# Patient Record
Sex: Female | Born: 1995 | Race: White | Hispanic: No | Marital: Single | Smoking: Never smoker
Health system: Western US, Academic
[De-identification: ages and names within clinical notes are randomized; demographics above are authoritative.]

## PROBLEM LIST (undated history)

## (undated) DIAGNOSIS — F410 Panic disorder [episodic paroxysmal anxiety] without agoraphobia: Secondary | ICD-10-CM

## (undated) DIAGNOSIS — E0590 Thyrotoxicosis, unspecified without thyrotoxic crisis or storm: Secondary | ICD-10-CM

## (undated) DIAGNOSIS — F250 Schizoaffective disorder, bipolar type: Secondary | ICD-10-CM

## (undated) DIAGNOSIS — F909 Attention-deficit hyperactivity disorder, unspecified type: Secondary | ICD-10-CM

## (undated) HISTORY — DX: Attention-deficit hyperactivity disorder, unspecified type: F90.9

---
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
## 2016-03-21 ENCOUNTER — Emergency Department (HOSPITAL_COMMUNITY): Payer: BLUE CROSS/BLUE SHIELD

## 2016-03-21 ENCOUNTER — Encounter (HOSPITAL_COMMUNITY): Payer: Self-pay | Admitting: Emergency Medicine

## 2016-03-21 ENCOUNTER — Emergency Department (HOSPITAL_COMMUNITY)
Admission: EM | Admit: 2016-03-21 | Discharge: 2016-03-23 | Disposition: A | Discharge status: Other Acute Inpatient Hospital | Payer: BLUE CROSS/BLUE SHIELD | Attending: Emergency Medicine | Admitting: Emergency Medicine

## 2016-03-21 DIAGNOSIS — N760 Acute vaginitis: Secondary | ICD-10-CM | POA: Insufficient documentation

## 2016-03-21 DIAGNOSIS — Z79899 Other long term (current) drug therapy: Secondary | ICD-10-CM | POA: Diagnosis not present

## 2016-03-21 DIAGNOSIS — R45851 Suicidal ideations: Secondary | ICD-10-CM | POA: Diagnosis present

## 2016-03-21 DIAGNOSIS — F909 Attention-deficit hyperactivity disorder, unspecified type: Secondary | ICD-10-CM | POA: Insufficient documentation

## 2016-03-21 DIAGNOSIS — F17210 Nicotine dependence, cigarettes, uncomplicated: Secondary | ICD-10-CM | POA: Diagnosis not present

## 2016-03-21 DIAGNOSIS — F411 Generalized anxiety disorder: Secondary | ICD-10-CM | POA: Diagnosis present

## 2016-03-21 DIAGNOSIS — F332 Major depressive disorder, recurrent severe without psychotic features: Secondary | ICD-10-CM | POA: Diagnosis not present

## 2016-03-21 DIAGNOSIS — B9689 Other specified bacterial agents as the cause of diseases classified elsewhere: Secondary | ICD-10-CM | POA: Diagnosis not present

## 2016-03-21 LAB — COMPREHENSIVE METABOLIC PANEL
ALT: 20 U/L (ref 14–54)
AST: 28 U/L (ref 15–41)
Albumin: 4.7 g/dL (ref 3.5–5.0)
Alkaline Phosphatase: 72 U/L (ref 38–126)
Anion gap: 7 (ref 5–15)
BILIRUBIN TOTAL: 0.8 mg/dL (ref 0.3–1.2)
BUN: 14 mg/dL (ref 6–20)
CALCIUM: 9.5 mg/dL (ref 8.9–10.3)
CHLORIDE: 103 mmol/L (ref 101–111)
CO2: 26 mmol/L (ref 22–32)
CREATININE: 0.73 mg/dL (ref 0.44–1.00)
Glucose, Bld: 89 mg/dL (ref 65–99)
Potassium: 4.5 mmol/L (ref 3.5–5.1)
Sodium: 136 mmol/L (ref 135–145)
TOTAL PROTEIN: 7.8 g/dL (ref 6.5–8.1)

## 2016-03-21 LAB — I-STAT BETA HCG BLOOD, ED (MC, WL, AP ONLY)

## 2016-03-21 LAB — URINALYSIS, ROUTINE W REFLEX MICROSCOPIC
Bilirubin Urine: NEGATIVE
Glucose, UA: NEGATIVE mg/dL
KETONES UR: NEGATIVE mg/dL
LEUKOCYTES UA: NEGATIVE
NITRITE: NEGATIVE
PROTEIN: NEGATIVE mg/dL
Specific Gravity, Urine: 1.009 (ref 1.005–1.030)
pH: 7 (ref 5.0–8.0)

## 2016-03-21 LAB — WET PREP, GENITAL
Sperm: NONE SEEN
Trich, Wet Prep: NONE SEEN
YEAST WET PREP: NONE SEEN

## 2016-03-21 LAB — CBC
HCT: 43.9 % (ref 36.0–46.0)
Hemoglobin: 15 g/dL (ref 12.0–15.0)
MCH: 30.1 pg (ref 26.0–34.0)
MCHC: 34.2 g/dL (ref 30.0–36.0)
MCV: 88 fL (ref 78.0–100.0)
PLATELETS: 339 10*3/uL (ref 150–400)
RBC: 4.99 MIL/uL (ref 3.87–5.11)
RDW: 13.6 % (ref 11.5–15.5)
WBC: 10.6 10*3/uL — ABNORMAL HIGH (ref 4.0–10.5)

## 2016-03-21 LAB — ETHANOL

## 2016-03-21 LAB — URINE MICROSCOPIC-ADD ON

## 2016-03-21 LAB — SALICYLATE LEVEL

## 2016-03-21 LAB — RAPID URINE DRUG SCREEN, HOSP PERFORMED
Amphetamines: NOT DETECTED
Barbiturates: NOT DETECTED
Benzodiazepines: POSITIVE — AB
Cocaine: NOT DETECTED
OPIATES: NOT DETECTED
Tetrahydrocannabinol: POSITIVE — AB

## 2016-03-21 LAB — ACETAMINOPHEN LEVEL: Acetaminophen (Tylenol), Serum: <10 ug/mL — ABNORMAL LOW (ref 10–30)

## 2016-03-21 MED ORDER — NICOTINE 7 MG/24HR TD PT24
7.0000 mg | MEDICATED_PATCH | Freq: Once | TRANSDERMAL | Status: AC
  Administered 2016-03-21: 7 mg via TRANSDERMAL
  Filled 2016-03-21: qty 1

## 2016-03-21 MED ORDER — ARIPIPRAZOLE 10 MG PO TABS
10.0000 mg | ORAL_TABLET | Freq: Every day | ORAL | Status: DC
  Administered 2016-03-21 – 2016-03-23 (×3): 10 mg via ORAL
  Filled 2016-03-21 (×3): qty 1

## 2016-03-21 MED ORDER — ALUM & MAG HYDROXIDE-SIMETH 200-200-20 MG/5ML PO SUSP: 30.0000 mL | ORAL | Status: DC | PRN

## 2016-03-21 MED ORDER — ALPRAZOLAM 0.5 MG PO TABS
0.5000 mg | ORAL_TABLET | Freq: Two times a day (BID) | ORAL | Status: DC | PRN
  Administered 2016-03-21 – 2016-03-22 (×3): 0.5 mg via ORAL
  Filled 2016-03-21 (×3): qty 1

## 2016-03-21 MED ORDER — IBUPROFEN 200 MG PO TABS
600.0000 mg | ORAL_TABLET | Freq: Three times a day (TID) | ORAL | Status: DC | PRN
  Administered 2016-03-22: 600 mg via ORAL
  Filled 2016-03-21: qty 3

## 2016-03-21 MED ORDER — METRONIDAZOLE 500 MG PO TABS
500.0000 mg | ORAL_TABLET | Freq: Two times a day (BID) | ORAL | Status: DC
  Administered 2016-03-21 – 2016-03-23 (×5): 500 mg via ORAL
  Filled 2016-03-21 (×5): qty 1

## 2016-03-21 MED ORDER — ZOLPIDEM TARTRATE 5 MG PO TABS
5.0000 mg | ORAL_TABLET | Freq: Every evening | ORAL | Status: DC | PRN
  Administered 2016-03-21: 5 mg via ORAL
  Filled 2016-03-21: qty 1

## 2016-03-21 MED ORDER — ONDANSETRON HCL 4 MG PO TABS: 4.0000 mg | ORAL_TABLET | Freq: Three times a day (TID) | ORAL | Status: DC | PRN

## 2016-03-21 MED ORDER — HYDROXYZINE HCL 25 MG PO TABS
25.0000 mg | ORAL_TABLET | Freq: Once | ORAL | Status: AC
  Administered 2016-03-21: 25 mg via ORAL
  Filled 2016-03-21: qty 1

## 2016-03-21 MED ORDER — ACETAMINOPHEN 325 MG PO TABS: 650.0000 mg | ORAL_TABLET | ORAL | Status: DC | PRN

## 2016-03-21 MED ORDER — IBUPROFEN 200 MG PO TABS
600.0000 mg | ORAL_TABLET | Freq: Once | ORAL | Status: AC
  Administered 2016-03-21: 600 mg via ORAL
  Filled 2016-03-21: qty 3

## 2016-03-21 NOTE — BH Assessment (Signed)
[facility] Assessment Progress Note  Case was staffed with Rankin NP who recommended an inpatient admission as appropriate bed placement is investigated.

## 2016-03-21 NOTE — BH Assessment (Signed)
[facility] Hudson[facility], confirms adult unit is at capacity. Faxed clinical information to the following facilities for placement:  [facility]   [name] [name] Jr, [facility], [facility], [facility] Triage Specialist [phone]
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
## 2016-03-22 ENCOUNTER — Encounter (HOSPITAL_COMMUNITY): Payer: Self-pay | Admitting: Registered Nurse

## 2016-03-22 DIAGNOSIS — R45851 Suicidal ideations: Secondary | ICD-10-CM | POA: Insufficient documentation

## 2016-03-22 DIAGNOSIS — F411 Generalized anxiety disorder: Secondary | ICD-10-CM

## 2016-03-22 DIAGNOSIS — F332 Major depressive disorder, recurrent severe without psychotic features: Secondary | ICD-10-CM | POA: Diagnosis not present

## 2016-03-22 DIAGNOSIS — B9689 Other specified bacterial agents as the cause of diseases classified elsewhere: Secondary | ICD-10-CM | POA: Insufficient documentation

## 2016-03-22 DIAGNOSIS — N760 Acute vaginitis: Secondary | ICD-10-CM

## 2016-03-22 DIAGNOSIS — Z79899 Other long term (current) drug therapy: Secondary | ICD-10-CM

## 2016-03-22 DIAGNOSIS — F17210 Nicotine dependence, cigarettes, uncomplicated: Secondary | ICD-10-CM

## 2016-03-22 LAB — PREGNANCY, URINE: Preg Test, Ur: NEGATIVE

## 2016-03-22 MED ORDER — TRAZODONE HCL 50 MG PO TABS
50.0000 mg | ORAL_TABLET | Freq: Every evening | ORAL | Status: DC | PRN
Start: 2016-03-22 — End: 2016-03-23
  Administered 2016-03-22: 50 mg via ORAL
  Filled 2016-03-22: qty 1

## 2016-03-22 MED ORDER — MIRTAZAPINE 7.5 MG PO TABS
7.5000 mg | ORAL_TABLET | Freq: Every day | ORAL | Status: DC
  Administered 2016-03-22: 7.5 mg via ORAL
  Filled 2016-03-22: qty 1

## 2016-03-22 MED ORDER — TRAZODONE HCL 50 MG PO TABS
50.0000 mg | ORAL_TABLET | Freq: Every day | ORAL | Status: DC
Start: 2016-03-22 — End: 2016-03-22

## 2016-03-22 MED ORDER — SERTRALINE HCL 50 MG PO TABS
25.0000 mg | ORAL_TABLET | Freq: Every day | ORAL | Status: DC
  Administered 2016-03-22: 25 mg via ORAL
  Filled 2016-03-22: qty 1

## 2016-03-22 MED ORDER — GABAPENTIN 100 MG PO CAPS: 200.0000 mg | ORAL_CAPSULE | Freq: Two times a day (BID) | ORAL | Status: DC

## 2016-03-22 MED ORDER — NICOTINE 7 MG/24HR TD PT24
7.0000 mg | MEDICATED_PATCH | Freq: Every day | TRANSDERMAL | Status: DC
  Administered 2016-03-22 – 2016-03-23 (×2): 7 mg via TRANSDERMAL
  Filled 2016-03-22: qty 1

## 2016-03-22 MED ORDER — HYDROXYZINE HCL 25 MG PO TABS
25.0000 mg | ORAL_TABLET | Freq: Three times a day (TID) | ORAL | Status: DC | PRN
  Administered 2016-03-22 – 2016-03-23 (×3): 25 mg via ORAL
  Filled 2016-03-22 (×4): qty 1
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
## 2016-03-22 NOTE — BH Assessment (Signed)
[facility] Assessment Progress Note    03/22/16: Patient continues to meet criteria for inpatient treatment as appropriate bed placement is investigated.     

## 2016-03-22 NOTE — ED Notes (Signed)
[facility] Charity fundraiserN. [facility]

## 2016-03-22 NOTE — ED Notes (Signed)
Report to include Situation, Background, Assessment, and Recommendations received from [place] RN. Patient alert and oriented, warm and dry, in no acute distress. Patient denies SI, HI, AVH and pain. Patient made aware of Q15 minute rounds and security cameras for their safety. Patient instructed to come to me with needs or concerns. 

## 2016-03-22 NOTE — ED Notes (Signed)
Patient noted in room. No complaints, stable, in no acute distress. Q15 minute rounds and monitoring via Security Cameras to continue.  

## 2016-03-22 NOTE — ED Notes (Signed)
Patient noted sleeping in room. No complaints, stable, in no acute distress. Q15 minute rounds and monitoring via Security Cameras to continue.  
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
## 2016-03-23 ENCOUNTER — Encounter (HOSPITAL_COMMUNITY): Payer: Self-pay

## 2016-03-23 ENCOUNTER — Inpatient Hospital Stay (HOSPITAL_COMMUNITY)
Admission: AD | Admit: 2016-03-23 | Discharge: 2016-03-25 | DRG: 880 | Disposition: A | Discharge status: Home / Self Care | Payer: BLUE CROSS/BLUE SHIELD | Source: Intra-hospital | Attending: Psychiatry | Admitting: Psychiatry

## 2016-03-23 DIAGNOSIS — F1410 Cocaine abuse, uncomplicated: Secondary | ICD-10-CM | POA: Diagnosis present

## 2016-03-23 DIAGNOSIS — Z915 Personal history of self-harm: Secondary | ICD-10-CM | POA: Diagnosis not present

## 2016-03-23 DIAGNOSIS — Z79899 Other long term (current) drug therapy: Secondary | ICD-10-CM | POA: Diagnosis not present

## 2016-03-23 DIAGNOSIS — F332 Major depressive disorder, recurrent severe without psychotic features: Secondary | ICD-10-CM | POA: Diagnosis present

## 2016-03-23 DIAGNOSIS — F909 Attention-deficit hyperactivity disorder, unspecified type: Secondary | ICD-10-CM | POA: Diagnosis present

## 2016-03-23 DIAGNOSIS — R45851 Suicidal ideations: Secondary | ICD-10-CM | POA: Diagnosis present

## 2016-03-23 DIAGNOSIS — A599 Trichomoniasis, unspecified: Secondary | ICD-10-CM | POA: Diagnosis present

## 2016-03-23 DIAGNOSIS — F1514 Other stimulant abuse with stimulant-induced mood disorder: Secondary | ICD-10-CM | POA: Diagnosis present

## 2016-03-23 DIAGNOSIS — F17210 Nicotine dependence, cigarettes, uncomplicated: Secondary | ICD-10-CM | POA: Diagnosis present

## 2016-03-23 DIAGNOSIS — F411 Generalized anxiety disorder: Principal | ICD-10-CM | POA: Diagnosis present

## 2016-03-23 DIAGNOSIS — F1010 Alcohol abuse, uncomplicated: Secondary | ICD-10-CM | POA: Diagnosis present

## 2016-03-23 DIAGNOSIS — G4700 Insomnia, unspecified: Secondary | ICD-10-CM | POA: Diagnosis present

## 2016-03-23 DIAGNOSIS — F603 Borderline personality disorder: Secondary | ICD-10-CM | POA: Diagnosis present

## 2016-03-23 DIAGNOSIS — F1220 Cannabis dependence, uncomplicated: Secondary | ICD-10-CM | POA: Diagnosis present

## 2016-03-23 DIAGNOSIS — F1490 Cocaine use, unspecified, uncomplicated: Secondary | ICD-10-CM

## 2016-03-23 DIAGNOSIS — F1994 Other psychoactive substance use, unspecified with psychoactive substance-induced mood disorder: Secondary | ICD-10-CM | POA: Insufficient documentation

## 2016-03-23 DIAGNOSIS — F0630 Mood disorder due to known physiological condition, unspecified: Secondary | ICD-10-CM

## 2016-03-23 LAB — GC/CHLAMYDIA PROBE AMP (~~LOC~~) NOT AT ARMC
CHLAMYDIA, DNA PROBE: NEGATIVE
Neisseria Gonorrhea: NEGATIVE

## 2016-03-23 MED ORDER — ACETAMINOPHEN 325 MG PO TABS: 650.0000 mg | ORAL_TABLET | Freq: Four times a day (QID) | ORAL | Status: DC | PRN

## 2016-03-23 MED ORDER — ARIPIPRAZOLE 10 MG PO TABS
10.0000 mg | ORAL_TABLET | Freq: Every day | ORAL | Status: DC
  Administered 2016-03-23 – 2016-03-25 (×3): 10 mg via ORAL
  Filled 2016-03-23 (×7): qty 1

## 2016-03-23 MED ORDER — MAGNESIUM HYDROXIDE 400 MG/5ML PO SUSP: 30.0000 mL | Freq: Every day | ORAL | Status: DC | PRN

## 2016-03-23 MED ORDER — NICOTINE 7 MG/24HR TD PT24
7.0000 mg | MEDICATED_PATCH | Freq: Every day | TRANSDERMAL | Status: DC
  Filled 2016-03-23 (×3): qty 1

## 2016-03-23 MED ORDER — MIRTAZAPINE 7.5 MG PO TABS
7.5000 mg | ORAL_TABLET | Freq: Every day | ORAL | Status: DC
  Administered 2016-03-23: 7.5 mg via ORAL
  Filled 2016-03-23 (×3): qty 1

## 2016-03-23 MED ORDER — IBUPROFEN 600 MG PO TABS
600.0000 mg | ORAL_TABLET | Freq: Three times a day (TID) | ORAL | Status: DC | PRN
  Administered 2016-03-23 – 2016-03-24 (×2): 600 mg via ORAL
  Filled 2016-03-23 (×2): qty 1

## 2016-03-23 MED ORDER — HYDROXYZINE HCL 50 MG PO TABS
50.0000 mg | ORAL_TABLET | Freq: Four times a day (QID) | ORAL | Status: DC | PRN
  Administered 2016-03-23 – 2016-03-24 (×4): 50 mg via ORAL
  Filled 2016-03-23 (×4): qty 1

## 2016-03-23 MED ORDER — TRAZODONE HCL 50 MG PO TABS
50.0000 mg | ORAL_TABLET | Freq: Every evening | ORAL | Status: DC | PRN
  Administered 2016-03-23 – 2016-03-24 (×2): 50 mg via ORAL
  Filled 2016-03-23 (×2): qty 1

## 2016-03-23 MED ORDER — ONDANSETRON HCL 4 MG PO TABS: 4.0000 mg | ORAL_TABLET | Freq: Three times a day (TID) | ORAL | Status: DC | PRN

## 2016-03-23 MED ORDER — METRONIDAZOLE 500 MG PO TABS
500.0000 mg | ORAL_TABLET | Freq: Two times a day (BID) | ORAL | Status: DC
  Administered 2016-03-23 – 2016-03-25 (×4): 500 mg via ORAL
  Filled 2016-03-23 (×8): qty 1

## 2016-03-23 MED ORDER — HYDROXYZINE HCL 25 MG PO TABS: 25.0000 mg | ORAL_TABLET | Freq: Three times a day (TID) | ORAL | Status: DC | PRN

## 2016-03-23 MED ORDER — NICOTINE 14 MG/24HR TD PT24
14.0000 mg | MEDICATED_PATCH | Freq: Every day | TRANSDERMAL | Status: DC
  Administered 2016-03-23 – 2016-03-25 (×2): 14 mg via TRANSDERMAL
  Filled 2016-03-23 (×6): qty 1

## 2016-03-23 MED ORDER — ALUM & MAG HYDROXIDE-SIMETH 200-200-20 MG/5ML PO SUSP: 30.0000 mL | ORAL | Status: DC | PRN
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
## 2016-03-23 NOTE — ED Notes (Signed)
Patient noted sleeping in room. No complaints, stable, in no acute distress. Q15 minute rounds and monitoring via Security Cameras to continue.  

## 2016-03-23 NOTE — ED Notes (Signed)
Pt discharged ambulatory with [place] driver.  Pt was calm and cooperative.  She denied S/I and H/I.  All belongings were sent with pt.

## 2016-03-23 NOTE — Tx Team (Addendum)
Initial Treatment Plan 03/23/2016 7:39 PM Kathy RolesMorgan Cheramie NWG:[id number]    PATIENT STRESSORS: Substance abuse   PATIENT STRENGTHS: Average or above average intelligence Communication skills   PATIENT IDENTIFIED PROBLEMS: Anxiety   Mood dysregulation   Risk for suicide  "get help"               DISCHARGE CRITERIA:  Adequate post-discharge living arrangements Improved stabilization in mood, thinking, and/or behavior Verbal commitment to aftercare and medication compliance  PRELIMINARY DISCHARGE PLAN: Attend PHP/IOP Attend 12-step recovery group Outpatient therapy  PATIENT/FAMILY INVOLVEMENT: This treatment plan has been presented to and reviewed with the patient, Kathy Hudson.  The patient and family have been given the opportunity to ask questions and make suggestions.  Larina Earthlyopson, Christa E, RN 03/23/2016, 7:39 PM
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
## 2016-03-23 NOTE — BH Assessment (Signed)
[facility] Assessment Progress Note  Per Thedore MinsMojeed Akintayo, MD, this pt [facility].  Per morning shift report, pt has been assigned to an Observation bed.  Pt has signed Voluntary Admission and Consent for Treatment, as well as Consent to Release Information to her parents, and to her outpatient providers, Dr Raquel JamesPittman and Durene Calaylor Krumroy, and signed forms have [facility].  Pt's nurse, Kendal Hymendie, has been notified, and agrees to send original paperwork along with pt via [facility], and to call report to [phone] or [phone].  Doylene Canninghomas Apphia Cropley, MA Triage Specialist [phone]

## 2016-03-23 NOTE — Progress Notes (Signed)
This Clinical research associatewriter informed pt that she was accepted to the adult unit based on her criteria. This Clinical research associatewriter ensured pt that she would be able to get visitation from her family as well as communicate with them on the phone. Pt stated "okay I am fine with going". Pt was receptive to this information and agreed to transfer to the unit. Kathy Hudson 03/23/16

## 2016-03-23 NOTE — Progress Notes (Signed)
Adult Psychoeducational Group Note  Date:  03/23/2016 Time:  10:26 PM  Group Topic/Focus:  Wrap-Up Group:   The focus of this group is to help patients review their daily goal of treatment and discuss progress on daily workbooks.   Participation Level:  Active  Participation Quality:  Appropriate  Affect:  Appropriate  Cognitive:  Alert  Insight: Appropriate  Engagement in Group:  Engaged  Modes of Intervention:  Discussion  Additional Comments:  Patient's goal was to get through the day. Larrisha Babineau L Raynah Gomes 03/23/2016, 10:26 PM

## 2016-03-23 NOTE — Progress Notes (Signed)
Patient ID: Kathy Hudson, female   DOB: Sep 11, 1995, 20 y.o.   MRN: [id number]  Writer received patient from Observation unit. Belongings re-searched and belongings able to come on the unit were given to patient. Relevant paper work was signed and patient was escorted to the unit. Patient was oriented to the unit and hygiene items were provided. Q15 minute safety checks were initiated and are maintained. Patient reports anxiety due to the fear of unknown being on the inpatient unit. Patient reports some tremors and stated that it started when she started on Abilify. Her left hand was visibly shaking at time of assessment. Patient also visibly anxious. She was given two stress balls to squeeze. She reports ETOH usage but minimizes use stating, "I don't have a problem with alcohol." She reports a bottle of wine atleast 4 times a week and does "black out" and harm self at times. She does have self inflicted scars on left forearm. She also reports THC and Cocaine usage. She does not speak to the positive benzodiazapine lab.

## 2016-03-23 NOTE — BH Assessment (Signed)
Pt has been accepted by Nira ConnJason Berry, [facility]. Notified Dr. Wilkie AyeHorton of acceptance.   Harlin RainFord [name] [name] Jr, LPC, [facility], [facility] Triage Specialist [phone]

## 2016-03-24 DIAGNOSIS — F411 Generalized anxiety disorder: Principal | ICD-10-CM

## 2016-03-24 DIAGNOSIS — A599 Trichomoniasis, unspecified: Secondary | ICD-10-CM

## 2016-03-24 DIAGNOSIS — F1490 Cocaine use, unspecified, uncomplicated: Secondary | ICD-10-CM

## 2016-03-24 DIAGNOSIS — F1010 Alcohol abuse, uncomplicated: Secondary | ICD-10-CM

## 2016-03-24 DIAGNOSIS — F1220 Cannabis dependence, uncomplicated: Secondary | ICD-10-CM

## 2016-03-24 DIAGNOSIS — Z79899 Other long term (current) drug therapy: Secondary | ICD-10-CM

## 2016-03-24 DIAGNOSIS — F332 Major depressive disorder, recurrent severe without psychotic features: Secondary | ICD-10-CM

## 2016-03-24 DIAGNOSIS — F1410 Cocaine abuse, uncomplicated: Secondary | ICD-10-CM

## 2016-03-24 LAB — TSH: TSH: 1.877 u[IU]/mL (ref 0.350–4.500)

## 2016-03-24 MED ORDER — LORAZEPAM 1 MG PO TABS
1.0000 mg | ORAL_TABLET | Freq: Four times a day (QID) | ORAL | Status: DC | PRN
  Administered 2016-03-24 – 2016-03-25 (×3): 1 mg via ORAL
  Filled 2016-03-24 (×3): qty 1

## 2016-03-24 MED ORDER — BUSPIRONE HCL 10 MG PO TABS
10.0000 mg | ORAL_TABLET | Freq: Two times a day (BID) | ORAL | Status: DC
  Administered 2016-03-24 – 2016-03-25 (×2): 10 mg via ORAL
  Filled 2016-03-24 (×6): qty 1
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
## 2016-03-24 NOTE — Progress Notes (Signed)
D: Pt denies SI/HI/AVH. Pt is pleasant and cooperative. Pt stated she was ready to leave tomorrow. Pt forwards little to Clinical research associatewriter.   A: Pt was offered support and encouragement. Pt was given scheduled medications. Pt was encourage to attend groups. Q 15 minute checks were done for safety.   R:Pt attends groups and interacts well with peers and staff. Pt is taking medication. Pt has no complaints.Pt receptive to treatment and safety maintained on unit.

## 2016-03-24 NOTE — Progress Notes (Signed)
D: Pt passive SI-contracts for safety denies HI/AVH. Pt is pleasant and cooperative. Pt very pessimistic with her attitude, pt stated she was doing this (rehab) for other people.   A: Pt was offered support and encouragement. Pt was given scheduled medications. Pt was encourage to attend groups. Q 15 minute checks were done for safety.   R:Pt attends groups and interacts well with peers and staff. Pt is taking medication. Pt receptive to treatment and safety maintained on unit.

## 2016-03-24 NOTE — Progress Notes (Signed)
Patient's BP rechecked manually and has stabilized. Pulse decreased since ativan admin.   Pitcher of gatorade given along with snack as patient remains pale and intake has been decreased today due to nausea, withdrawal.  Patient verbalizes understanding of need to push fluids and family remains at bed side.

## 2016-03-24 NOTE — [facility] Suicide Risk Assessment (Signed)
[facility]   Principal Problem: GAD (generalized anxiety disorder) Discharge Diagnoses:  Patient Active Problem List   Diagnosis Date Noted  . Trichomoniasis [A59.9] 03/24/2016  . Cocaine use [F14.10] 03/24/2016  . ETOH abuse [F10.10] 03/24/2016  . 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 MDD (major depressive disorder), recurrent severe, without psychosis (HCC) [F33.2] 03/23/2016  . Psychoactive substance-induced mood disorder (HCC) [R60.45[F19.94, F06.30] 03/23/2016  . Major depressive disorder, recurrent, severe without psychotic behavior (HCC) [F33.2] 03/22/2016  . GAD (generalized anxiety disorder) [F41.1] 03/22/2016  . BV (bacterial vaginosis) [N76.0, B96.89]   . Suicidal ideations [R45.851]   . ADHD (attention deficit hyperactivity disorder) [F90.9] 08/05/2014    Total Time spent with patient: 15 minutes  Musculoskeletal: Strength & Muscle Tone: within normal limits Gait & Station: normal Patient leans: N/A  Psychiatric Specialty Exam: ROS  Blood pressure 126/84, pulse (!) 120, temperature 97.9 F (36.6 C), temperature source Oral, resp. rate 18, height 5\' 4"  (1.626 m), weight 49.9 kg (110 lb), last menstrual period 02/04/2016, SpO2 98 %.Body mass index is 18.88 kg/m.  General Appearance: Casual  Eye Contact::  Good  Speech:  Clear and Coherent409  Volume:  Normal  Mood:  Anxious  Affect:  Congruent  Thought Process:  Coherent  Orientation:  Full (Time, Place, and Person)  Thought Content:  Negative  Suicidal Thoughts:  No  Homicidal Thoughts:  No  Memory:  Negative  Judgement:  Fair  Insight:  Fair  Psychomotor Activity:  Normal  Concentration:  Good  Recall:  Good  Fund of Knowledge:Good  Language: Good  Akathisia:  No  Handed:  Right  AIMS (if indicated):   0  Assets:  Resilience  Sleep:  Number of Hours: 5.5  Cognition: WNL  ADL's:  Intact   Mental Status Per Nursing Assessment::   On Admission:     Demographic  Factors:  Caucasian  Loss Factors: NA  Historical Factors: Prior suicide attempts, Family history of suicide, Family history of mental illness or substance abuse and Impulsivity  Risk Reduction Factors:   Living with another person, especially a relative, Positive social support, Positive therapeutic relationship and Positive coping skills or problem solving skills  Continued Clinical Symptoms:  Bipolar Disorder:   Mixed State Alcohol/Substance Abuse/Dependencies More than one psychiatric diagnosis  Cognitive Features That Contribute To Risk:  None    Suicide Risk:  Mild:  Suicidal ideation of limited frequency, intensity, duration, and specificity.  There are no identifiable plans, no associated intent, mild dysphoria and related symptoms, good self-control (both objective and subjective assessment), few other risk factors, and identifiable protective factors, including available and accessible social support.  Follow-up Information    [facility] Follow up on 03/25/2016.   Why:  Appt at 1:00PM with Ladona Ridgelaylor.  Contact information: [address] [place], [place] [address] Phone: [phone] Fax:       [facility] Follow up.   Specialty:  Behavioral Health Why:  message left requesting follow-up appt.  Contact information: [address] [place] [address] [phone]           Plan Of Care/Follow-up recommendations:  Other:  Patient denies suicidal or homicidal ideation, plan or intent at time of discharge. She will be returning to outpatient follow-up with her previous outpatient providers and is encouraged to abstain from substances, and keep her outpatient appointments.  Acquanetta SitElizabeth Woods Oates, MD 03/24/2016, 3:02 PM

## 2016-03-24 NOTE — Plan of Care (Signed)
Problem: Coping: Goal: Ability to identify and develop effective coping behavior will improve Outcome: Progressing Listening to music or writing in journal

## 2016-03-24 NOTE — Progress Notes (Signed)
Patient has had difficult time with withdrawal symptoms today. Complaining of nausea, tremors (observed) and shakiness, observable sweats, anxiety and elevated BP and pulse (see doc flowsheets).  Ativan prn given at 1200 and 1800 for CIWAs of an 11 and 12. Davis NP notified of evening VS (pulse is 149).   Will recheck VS within 1 hour of ativan admin. Patient also became pale, weak and felt as if she was going to pass out after blood draw. Assisted to room where she is currently lying down with family at bed side. Fluids provided and mother states patient has had this type of reaction since she was a young child.
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
## 2016-03-24 NOTE — Plan of Care (Signed)
Problem: Coping: Goal: Ability to identify and develop effective coping behavior will improve Outcome: Not Progressing Pt  Continues to endorse anxiety, but does not listen to advise about utilizing coping skills, she only comes up with excuses why she can't

## 2016-03-24 NOTE — Progress Notes (Signed)
Recreation Therapy Notes  Animal-Assisted Activity (AAA) Program Checklist/Progress Notes Patient Eligibility Criteria Checklist & Daily Group note for Rec TxIntervention  Date: 11.14.2017 Time: 2:45pm [address]  AAA/T Program Assumption of Risk Form signed by Patient/ or Parent Legal Guardian Yes  Patient is free of allergies or sever asthma Yes  Patient reports no fear of animals Yes  Patient reports no history of cruelty to animals Yes  Patient understands his/her participation is voluntary Yes  Behavioral Response: Did not attend.   Marykay Lexenise L Tejal Monroy, LRT/CTRS       Norman Bier L 03/24/2016 3:01 PM

## 2016-03-24 NOTE — [facility] Suicide Risk Assessment (Signed)
[facility]   Nursing information obtained from:    Demographic factors:    Current Mental Status:    Loss Factors:    Historical Factors:    Risk Reduction Factors:     Total Time spent with patient: 30 minutes Principal Problem: GAD (generalized anxiety disorder) Diagnosis:   Patient Active Problem List   Diagnosis Date Noted  . Trichomoniasis [A59.9] 03/24/2016  . Cocaine use [F14.10] 03/24/2016  . ETOH abuse [F10.10] 03/24/2016  . 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 MDD (major depressive disorder), recurrent severe, without psychosis ([facility]) [F33.2] 03/23/2016  . Psychoactive substance-induced mood disorder ([facility]) [W09.81[F19.94, F06.30] 03/23/2016  . Major depressive disorder, recurrent, severe without psychotic behavior ([facility]) [F33.2] 03/22/2016  . GAD (generalized anxiety disorder) [F41.1] 03/22/2016  . BV (bacterial vaginosis) [N76.0, B96.89]   . Suicidal ideations [R45.851]   . ADHD (attention deficit hyperactivity disorder) [F90.9] 08/05/2014   Subjective Data: Patient denies any current suicidal or homicidal ideation, plan or intent.  Continued Clinical Symptoms:  Alcohol Use Disorder Identification Test Final Score (AUDIT): 18 The "Alcohol Use Disorders Identification Test", Guidelines for Use in Primary Care, Second Edition.  World Science writerHealth Organization [facility](WHO). Score between 0-7:  no or low risk or alcohol related problems. Score between 8-15:  moderate risk of alcohol related problems. Score between 16-19:  high risk of alcohol related problems. Score 20 or above:  warrants further diagnostic evaluation for alcohol dependence and treatment.   CLINICAL FACTORS:   Severe Anxiety and/or Agitation Bipolar Disorder:   Mixed State Alcohol/Substance Abuse/Dependencies More than one psychiatric diagnosis Previous Psychiatric Diagnoses and Treatments   Musculoskeletal: Strength & Muscle Tone: within normal limits Gait & Station:  normal Patient leans: N/A  Psychiatric Specialty Exam: Physical Exam  ROS  Blood pressure 126/84, pulse (!) 120, temperature 97.9 F (36.6 C), temperature source Oral, resp. rate 18, height 5\' 4"  (1.626 m), weight 49.9 kg (110 lb), last menstrual period 02/04/2016, SpO2 98 %.Body mass index is 18.88 kg/m.   General Appearance: Casual  Eye Contact:  Good  Speech:  Clear and Coherent  Volume:  Normal  Mood:  Anxious and Euthymic  Affect:  Appropriate  Thought Process:  Coherent  Orientation:  Full (Time, Place, and Person)  Thought Content:  Negative  Suicidal Thoughts:  No  Homicidal Thoughts:  No  Memory:  Negative  Judgement:  Impaired  Insight:  Present  Psychomotor Activity:  Normal  Concentration:  Concentration: Good  Recall:  Good  Fund of Knowledge:  Good  Language:  Good  Akathisia:  No  Handed:  Right  AIMS (if indicated):   0  Assets:  Physical Health  ADL's:  Intact  Cognition:  WNL  Sleep:  Number of Hours: 5.5     COGNITIVE FEATURES THAT CONTRIBUTE TO RISK:  None    SUICIDE RISK:   Mild:  Suicidal ideation of limited frequency, intensity, duration, and specificity.  There are no identifiable plans, no associated intent, mild dysphoria and related symptoms, good self-control (both objective and subjective assessment), few other risk factors, and identifiable protective factors, including available and accessible social support.   PLAN OF CARE: see PAA  I certify that inpatient services furnished can reasonably be expected to improve the patient's condition.  Acquanetta SitElizabeth Woods Gean Larose, MD 03/24/2016, 12:45 PM

## 2016-03-24 NOTE — [facility] Suicide Risk Assessment (Signed)
[facility] INPATIENT:  Family/Significant Other Suicide Prevention Education  Suicide Prevention Education:  Education Completed; Kathy Hudson (pt's mother) [phone] has been identified by the patient as the family member/significant other with whom the patient will be residing, and identified as the person(s) who will aid the patient in the event of a mental health crisis (suicidal ideations/suicide attempt).  With written consent from the patient, the family member/significant other has been provided the following suicide prevention education, prior to the and/or following the discharge of the patient.  The suicide prevention education provided includes the following:  Suicide risk factors  Suicide prevention and interventions  National Suicide Hotline telephone number  [facility] assessment telephone number  [facility] Emergency Assistance 911  [facility] and/or Residential Mobile Crisis Unit telephone number  Request made of family/significant other to:  Remove weapons (e.g., guns, rifles, knives), all items previously/currently identified as safety concern.    Remove drugs/medications (over-the-counter, prescriptions, illicit drugs), all items previously/currently identified as a safety concern.  The family member/significant other verbalizes understanding of the suicide prevention education information provided.  The family member/significant other agrees to remove the items of safety concern listed above.  Kathy [place]ataway N Smart LCSW 03/24/2016, 1:48 PM

## 2016-03-24 NOTE — Tx Team (Signed)
Interdisciplinary Treatment and Diagnostic Plan Update  03/24/2016 Time of Session: 9:30AM Kathy Hudson MRN: [id number]  Principal Diagnosis: MDD  Secondary Diagnoses: Active Problems:   MDD (major depressive disorder), recurrent severe, without psychosis (HCC)   Psychoactive substance-induced mood disorder (HCC)   Current Medications:  Current Facility-Administered Medications  Medication Dose Route Frequency Provider Last Rate Last Dose  . acetaminophen (TYLENOL) tablet 650 mg  650 mg Oral Q6H PRN Charm RingsJamison Y Lord, NP      . alum & mag hydroxide-simeth (MAALOX/MYLANTA) 200-200-20 MG/5ML suspension 30 mL  30 mL Oral PRN Charm RingsJamison Y Lord, NP      . ARIPiprazole (ABILIFY) tablet 10 mg  10 mg Oral Daily Charm RingsJamison Y Lord, NP   10 mg at 03/24/16 0807  . busPIRone (BUSPAR) tablet 10 mg  10 mg Oral BID Acquanetta SitElizabeth Woods Oates, MD      . hydrOXYzine (ATARAX/VISTARIL) tablet 50 mg  50 mg Oral Q6H PRN Thermon LeylandLaura A Davis, NP   50 mg at 03/24/16 [id number]  . ibuprofen (ADVIL,MOTRIN) tablet 600 mg  600 mg Oral Q8H PRN Charm RingsJamison Y Lord, NP   600 mg at 03/23/16 2120  . LORazepam (ATIVAN) tablet 1 mg  1 mg Oral Q6H PRN Acquanetta SitElizabeth Woods Oates, MD      . magnesium hydroxide (MILK OF MAGNESIA) suspension 30 mL  30 mL Oral Daily PRN Charm RingsJamison Y Lord, NP      . metroNIDAZOLE (FLAGYL) tablet 500 mg  500 mg Oral Q12H Charm RingsJamison Y Lord, NP   500 mg at 03/24/16 [id number]  . nicotine (NICODERM CQ - dosed in mg/24 hours) patch 14 mg  14 mg Transdermal Daily Acquanetta SitElizabeth Woods Oates, MD   14 mg at 03/23/16 2020  . ondansetron (ZOFRAN) tablet 4 mg  4 mg Oral Q8H PRN Charm RingsJamison Y Lord, NP      . traZODone (DESYREL) tablet 50 mg  50 mg Oral QHS PRN Charm RingsJamison Y Lord, NP   50 mg at 03/23/16 2120   PTA Medications: Prescriptions Prior to Admission  Medication Sig Dispense Refill Last Dose  . ARIPiprazole (ABILIFY) 10 MG tablet Take 10 mg by mouth daily.   0 03/20/2016 at Unknown time    Patient Stressors: Substance abuse  Patient Strengths: Average  or above average intelligence Communication skills  Treatment Modalities: Medication Management, Group therapy, Case management,  1 to 1 session with clinician, Psychoeducation, Recreational therapy.   Physician Treatment Plan for Primary Diagnosis: MDD Long Term Goal(s):     Short Term Goals:    Medication Management: Evaluate patient's response, side effects, and tolerance of medication regimen.  Therapeutic Interventions: 1 to 1 sessions, Unit Group sessions and Medication administration.  Evaluation of Outcomes: Progressing  Physician Treatment Plan for Secondary Diagnosis: Active Problems:   MDD (major depressive disorder), recurrent severe, without psychosis (HCC)   Psychoactive substance-induced mood disorder (HCC)  Long Term Goal(s):     Short Term Goals:       Medication Management: Evaluate patient's response, side effects, and tolerance of medication regimen.  Therapeutic Interventions: 1 to 1 sessions, Unit Group sessions and Medication administration.  Evaluation of Outcomes: Progressing   RN Treatment Plan for Primary Diagnosis: MDD Long Term Goal(s): Knowledge of disease and therapeutic regimen to maintain health will improve  Short Term Goals: Ability to remain free from injury will improve, Ability to disclose and discuss suicidal ideas and Ability to identify and develop effective coping behaviors will improve  Medication Management: RN will administer medications as ordered  by provider, will assess and evaluate patient's response and provide education to patient for prescribed medication. RN will report any adverse and/or side effects to prescribing provider.  Therapeutic Interventions: 1 on 1 counseling sessions, Psychoeducation, Medication administration, Evaluate responses to treatment, Monitor vital signs and CBGs as ordered, Perform/monitor CIWA, COWS, AIMS and Fall Risk screenings as ordered, Perform wound care treatments as ordered.  Evaluation of  Outcomes: Progressing   LCSW Treatment Plan for Primary Diagnosis: MDD Long Term Goal(s): Safe transition to appropriate next level of care at discharge, Engage patient in therapeutic group addressing interpersonal concerns.  Short Term Goals: Engage patient in aftercare planning with referrals and resources, Facilitate patient progression through stages of change regarding substance use diagnoses and concerns and Identify triggers associated with mental health/substance abuse issues  Therapeutic Interventions: Assess for all discharge needs, 1 to 1 time with Social worker, Explore available resources and support systems, Assess for adequacy in community support network, Educate family and significant other(s) on suicide prevention, Complete Psychosocial Assessment, Interpersonal group therapy.  Evaluation of Outcomes: Progressing   Progress in Treatment: Attending groups: No. Patient new to unit. Continuing to assess.  Participating in groups: No. Taking medication as prescribed: Yes. Toleration medication: Yes. Family/Significant other contact made: No, will contact:  family member if patient consents. Patient understands diagnosis: Yes. Discussing patient identified problems/goals with staff: Yes. Medical problems stabilized or resolved: Yes. Denies suicidal/homicidal ideation: No. Passive SI/Able to contract for safety on the unit.  Issues/concerns per patient self-inventory: No. Other: n/a  New problem(s) identified: No, Describe:  n/a  New Short Term/Long Term Goal(s): Medication stabilization, development of appropriate and safe aftercare plan.   Discharge Plan or Barriers: CSW assessing for appropriate referrals. Patient currently goes to [facility] and sees Dr. Raquel JamesPittman for medication management. She has a diagnosis of Borderline personality Disorder and ADHD.   Reason for Continuation of Hospitalization: Anxiety Depression Medication stabilization Suicidal  ideation  Estimated Length of Stay: 3-5 days   Attendees: Patient: 03/24/2016 11:08 AM  Physician: Dr. Vallery RidgeElizabeth Oates MD 03/24/2016 11:08 AM  Nursing: Dorthula PerfectMarian, Caroline, Beverly RN 03/24/2016 11:08 AM  RN Care Manager: Onnie BoerJennifer Clark CM 03/24/2016 11:08 AM  Social Worker: Chartered loss adjusterHeather Smart, LCSW 03/24/2016 11:08 AM  Recreational Therapist:  03/24/2016 11:08 AM  Other: Claudette Headonrad Withrow NP; May Augustin NP 03/24/2016 11:08 AM  Other:  03/24/2016 11:08 AM  Other: 03/24/2016 11:08 AM    Scribe for Treatment Team: Ledell PeoplesHeather N Smart, LCSW 03/24/2016 11:08 AM

## 2016-03-24 NOTE — Plan of Care (Signed)
Problem: Self-Concept: Goal: Level of anxiety will decrease Outcome: Progressing Coping skills established

## 2016-03-24 NOTE — [facility] Group Notes (Signed)
[facility] LCSW Group Therapy  03/24/2016 1:58 PM  Type of Therapy:  Group Therapy  Participation Level:  Active  Participation Quality:  Attentive  Affect:  Appropriate  Cognitive:  Alert and Oriented  Insight:  Improving  Engagement in Therapy:  Improving  Modes of Intervention:  Discussion, Education, Exploration, Problem-solving, Rapport Building, Socialization and Support  Summary of Progress/Problems: [facility] Speaker came to talk about his personal journey with substance abuse and addiction. The pt processed ways by which to relate to the speaker. MHA speaker provided handouts and educational information pertaining to groups and services offered by the [facility].   Yukio Bisping N Smart LCSW 03/24/2016, 1:58 PM

## 2016-03-24 NOTE — Progress Notes (Signed)
Patient attended AA group meeting tonight.  

## 2016-03-24 NOTE — Progress Notes (Addendum)
Nursing Progress Note: 7a-7p D: Pt currently presents with a anxious/depressed  behavior. Pt states "I cant stop shaking. I am so anxious... 10 out of 10... I usually took 2 mg of Xanax every night." Pt reports poor sleep with current medication regimen.   A: Pt provided with medications per providers orders. Pt's labs and vitals were monitored throughout the day. Pt supported emotionally and encouraged to express concerns and questions. Pt educated on medications.  R: Pt's safety ensured with 15 minute and environmental checks. Pt currently denies SI/HI/Self Harm and A/V hallucinations. Pt verbally agrees to seek staff if SI/HI or A/VH occurs and to consult with staff before acting on any harmful thoughts. Will continue POC.

## 2016-03-24 NOTE — [facility] Group Notes (Signed)

## 2016-03-24 NOTE — Plan of Care (Signed)
Problem: Coping: Goal: Ability to cope will improve Outcome: Progressing Coping skills

## 2016-03-25 MED ORDER — ARIPIPRAZOLE 10 MG PO TABS: 10.0000 mg | ORAL_TABLET | Freq: Every day | ORAL | 0 refills | Status: DC

## 2016-03-25 MED ORDER — HYDROXYZINE HCL 50 MG PO TABS: 50.0000 mg | ORAL_TABLET | Freq: Four times a day (QID) | ORAL | 0 refills | Status: DC | PRN

## 2016-03-25 MED ORDER — TRAZODONE HCL 50 MG PO TABS: 50.0000 mg | ORAL_TABLET | Freq: Every evening | ORAL | 0 refills | Status: DC | PRN

## 2016-03-25 MED ORDER — METRONIDAZOLE 500 MG PO TABS: 500.0000 mg | ORAL_TABLET | Freq: Two times a day (BID) | ORAL | 0 refills | Status: DC

## 2016-03-25 MED ORDER — NICOTINE 14 MG/24HR TD PT24: 14.0000 mg | MEDICATED_PATCH | Freq: Every day | TRANSDERMAL | 0 refills | Status: DC

## 2016-03-25 MED ORDER — BUSPIRONE HCL 10 MG PO TABS: 10.0000 mg | ORAL_TABLET | Freq: Two times a day (BID) | ORAL | 0 refills | Status: DC

## 2016-03-25 NOTE — Progress Notes (Signed)
Recreation Therapy Notes  Date: 03/25/16 Time: 0930 Location: 300 [address] Dayroom  Group Topic: Stress Management  Goal Area(s) Addresses:  Patient will verbalize importance of using healthy stress management.  Patient will identify positive emotions associated with healthy stress management.   Behavioral Response: Engaged  Intervention: Calm App  Activity :  Resilience Meditation.  LRT introduced the stress management technique of meditation.  LRT played a meditation to help patients improve resilience.  Patients were to follow along with the recording to the best of their ability to engaged in the technique.  Education:  Stress Management, Discharge Planning.   Education Outcome: Acknowledges edcuation/In group clarification offered/Needs additional education  Clinical Observations/Feedback: Pt attended group.    Caroll RancherMarjette Jovonni Borquez, LRT/CTRS         Lillia AbedLindsay, Gideon Burstein A 03/25/2016 12:00 PM

## 2016-03-25 NOTE — Discharge Summary (Signed)
Physician Discharge Summary Note  Patient:  Kathy Hudson is an 20 y.o., female MRN:  [id number] DOB:  08/19/1995 Patient phone:  [phone] (home)  Patient address:   [address] [place] [address],  Total Time spent with patient: 30 minutes  Date of Admission:  03/23/2016 Date of Discharge: 03/25/2016  Reason for Admission:  Alcohol and cocaine abuse  Principal Problem: GAD (generalized anxiety disorder) Discharge Diagnoses: Patient Active Problem List   Diagnosis Date Noted  . Trichomoniasis [A59.9] 03/24/2016  . Cocaine use [F14.10] 03/24/2016  . ETOH abuse [F10.10] 03/24/2016  . 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 MDD (major depressive disorder), recurrent severe, without psychosis (HCC) [F33.2] 03/23/2016  . Psychoactive substance-induced mood disorder (HCC) [N62.95, F06.30] 03/23/2016  . Major depressive disorder, recurrent, severe without psychotic behavior (HCC) [F33.2] 03/22/2016  . GAD (generalized anxiety disorder) [F41.1] 03/22/2016  . BV (bacterial vaginosis) [N76.0, B96.89]   . Suicidal ideations [R45.851]   . ADHD (attention deficit hyperactivity disorder) [F90.9] 08/05/2014    Past Psychiatric History:  See HPI  Past Medical History:  Past Medical History:  Diagnosis Date  . ADHD (attention deficit hyperactivity disorder)    History reviewed. No pertinent surgical history. Family History: No family history on file. Family Psychiatric  History:  See HPI Social History:  History  Alcohol Use No     History  Drug Use  . Types: Benzodiazepines, Marijuana    Social History   Social History  . Marital status: Single    Spouse name: N/A  . Number of children: N/A  . Years of education: N/A   Social History Main Topics  . Smoking status: Light Tobacco Smoker    Types: Cigarettes  . Smokeless tobacco: Never Used  . Alcohol use No  . Drug use:     Types: Benzodiazepines, Marijuana  . Sexual activity: Yes    Birth  control/ protection: None   Other Topics Concern  . None   Social History Narrative  . None    Hospital Course:  Kathy Hudson, 20 yo was admitted after she abused alcohol and cocaine as well as taking Xanax that she obtained from an outpatient provider.  This brought on a manic phase wherein she engaged in highly unsafe sexual activity.    Kathy Hudson was admitted for GAD (generalized anxiety disorder) and crisis management.  Patient was treated with medications with their indications listed below in detail under Medication List.  Medical problems were identified and treated as needed.  Home medications were restarted as appropriate.  Improvement was monitored by observation and Kathy Hudson daily report of symptom reduction.  Emotional and mental status was monitored by daily self inventory reports completed by Kathy Hudson and clinical staff.  Patient reported continued improvement, denied any new concerns.  Patient had been compliant on medications and denied side effects.  Support and encouragement was provided.    Patient encouraged to attend groups to help with recognizing triggers of emotional crises and de-stabilizations.  Patient encouraged to attend group to help identify the positive things in life that would help in dealing with feelings of loss, depression and unhealthy or abusive tendencies.         Kathy Hudson was evaluated by the treatment team for stability and plans for continued recovery upon discharge.  Patient was offered further treatment options upon discharge including Residential, Intensive Outpatient and Outpatient treatment. Patient will follow up with agency listed below for medication management and counseling.  Encouraged patient  to maintain satisfactory support network and home environment.  Advised to adhere to medication compliance and outpatient treatment follow up.  Prescriptions provided.       Kathy Hudson motivation was an integral factor for scheduling  further treatment.  Employment, transportation, bed availability, health status, family support, and any pending legal issues were also considered during patient's hospital stay.  Upon completion of this admission the patient was both mentally and medically stable for discharge denying suicidal/homicidal ideation, auditory/visual/tactile hallucinations, delusional thoughts and paranoia.      Physical Findings: AIMS:  , ,  ,  ,    CIWA:  CIWA-Ar Total: 12 COWS:     Musculoskeletal: Strength & Muscle Tone: within normal limits Gait & Station: normal Patient leans: N/A  Psychiatric Specialty Exam: Physical Exam  Nursing note and vitals reviewed. Psychiatric: She has a normal mood and affect. Her speech is normal and behavior is normal. Judgment and thought content normal. Cognition and memory are normal.    Review of Systems  Constitutional: Negative.   HENT: Negative.   Eyes: Negative.   Respiratory: Negative.   Cardiovascular: Negative.   Gastrointestinal: Negative.   Genitourinary: Negative.   Musculoskeletal: Negative.   Skin: Negative.   Neurological: Negative.   Endo/Heme/Allergies: Negative.   Psychiatric/Behavioral: Negative.     Blood pressure 124/75, pulse (!) 112, temperature 98.2 F (36.8 C), temperature source Oral, resp. rate 16, height 5\' 4"  (1.626 m), weight 49.9 kg (110 lb), last menstrual period 02/04/2016, SpO2 98 %.Body mass index is 18.88 kg/m.    Have you used any form of tobacco in the last 30 days? (Cigarettes, Smokeless Tobacco, Cigars, and/or Pipes): Yes  Has this patient used any form of tobacco in the last 30 days? (Cigarettes, Smokeless Tobacco, Cigars, and/or Pipes) Yes, N/A  Blood Alcohol level:  Lab Results  Component Value Date   ETH <5 03/21/2016    Metabolic Disorder Labs:  No results found for: HGBA1C, MPG No results found for: PROLACTIN No results found for: CHOL, TRIG, HDL, CHOLHDL, VLDL, LDLCALC  See Psychiatric Specialty Exam and  Suicide Risk Assessment completed by Attending Physician prior to discharge.  Discharge destination:  Home  Is patient on multiple antipsychotic therapies at discharge:  No   Has Patient had three or more failed trials of antipsychotic monotherapy by history:  No  Recommended Plan for Multiple Antipsychotic Therapies: NA     Medication List    TAKE these medications     Indication  ARIPiprazole 10 MG tablet Commonly known as:  ABILIFY Take 1 tablet (10 mg total) by mouth daily. Start taking on:  03/26/2016  Indication:  mood stabilization   busPIRone 10 MG tablet Commonly known as:  BUSPAR Take 1 tablet (10 mg total) by mouth 2 (two) times daily.  Indication:  Generalized Anxiety Disorder   hydrOXYzine 50 MG tablet Commonly known as:  ATARAX/VISTARIL Take 1 tablet (50 mg total) by mouth every 6 (six) hours as needed for anxiety.  Indication:  Anxiety Neurosis   metroNIDAZOLE 500 MG tablet Commonly known as:  FLAGYL Take 1 tablet (500 mg total) by mouth every 12 (twelve) hours.  Indication:  Vaginosis caused by Bacteria   nicotine 14 mg/24hr patch Commonly known as:  NICODERM CQ - dosed in mg/24 hours Place 1 patch (14 mg total) onto the skin daily. Start taking on:  03/26/2016  Indication:  Nicotine Addiction   traZODone 50 MG tablet Commonly known as:  DESYREL Take 1 tablet (50 mg  total) by mouth at bedtime as needed for sleep.  Indication:  Trouble Sleeping      Follow-up Information    [facility] Follow up on 03/25/2016.   Why:  Appt at 1:00PM with Ladona Ridgelaylor.  Contact information: [address] [place], [place] [address] Phone: [phone] Fax:       [facility] Follow up on 05/27/2016.   Specialty:  Behavioral Health Why:  Appt on this date at 11:00AM. You have been placed on cancellation list and will be called by the office if they get an earlier opening. Thank you.  Contact information: [address] [place] [address] [phone]          Follow-up recommendations:  Activity:  as tol Diet:  as tol  Comments:  1.  Take all your medications as prescribed.   2.  Report any adverse side effects to outpatient provider. 3.  Patient instructed to not use alcohol or illegal drugs while on prescription medicines. 4.  In the event of worsening symptoms, instructed patient to call 911, the crisis hotline or go to nearest emergency room for evaluation of symptoms.  Signed: Lindwood QuaSheila May Anabeth Chilcott, NP [facility] 03/25/2016, 10:55 AM

## 2016-03-25 NOTE — Progress Notes (Signed)
  [facility] Dis[facility] :  Will you be returning to the same living situation after discharge:  Yes,  home At discharge, do you have transportation home?: Yes,  mom coming at 12pm to take patient directly to her therapist appt Do you have the ability to pay for your medications: Yes,  [employer]  Release of information consent forms completed and submitted to medical records by CSW.  Patient to Follow up at: Follow-up Information    [facility] Follow up on 03/25/2016.   Why:  Appt at 1:00PM with Ladona Ridgelaylor.  Contact information: [address] [place], [place] [address] Phone: [phone] Fax:       [facility] Follow up on 05/27/2016.   Specialty:  Behavioral Health Why:  Appt on this date at 11:00AM. You have been placed on cancellation list and will be called by the office if they get an earlier opening. Thank you.  Contact information: [address] [place] [address] [phone]           Next level of care provider has access to [facility] Link:no  Safety Planning and Suicide Prevention discussed: Yes,  SPE completed with pt and her mother. SPI pamphlet and Mobile Crisis information provided to patient  Have you used any form of tobacco in the last 30 days? (Cigarettes, Smokeless Tobacco, Cigars, and/or Pipes): Yes  Has patient been referred to the Quitline?: Patient refused referral  Patient has been referred for addiction treatment: Yes  Slate Debroux N Smart LCSW 03/25/2016, 8:32 AM

## 2016-03-25 NOTE — Progress Notes (Signed)
Pt discharged from unit after AVS, transition report, prescriptions, and suicide risk assessment were reviewed. Pt confirmed information with teach back. Pt denies SI/HI/AVH and contracts to seek support if thoughts as such occur. Patient was offered support and encouragement. All belongings returned to patient. Pts questions were answered and was escorted safely to transportation.   

## 2016-03-25 NOTE — Progress Notes (Signed)
Nursing Progress Note: 7a-7p D: Pt currently presents with a anxious/pleasant behavior. Pt reports to writer that their goal is to "get discharged." Pt states "I feel like my meds are really working. I'm ready to get back to school." Pt reports excellent sleep with current medication regimen.   A: Pt provided with medications per providers orders. Pt's labs and vitals were monitored throughout the day. Pt supported emotionally and encouraged to express concerns and questions. Pt educated on medications.  R: Pt's safety ensured with 15 minute and environmental checks. Pt currently denies SI/HI/Self Harm and A/V hallucinations. Pt verbally agrees to seek staff if SI/HI or A/VH occurs and to consult with staff before acting on any harmful thoughts. Will continue POC. Pt was educated on discharge and wait for her ride.

## 2016-03-25 NOTE — Tx Team (Signed)
Interdisciplinary Treatment and Diagnostic Plan Update  03/25/2016 Time of Session: 9:30AM Kathy Hudson MRN: [id number]  Principal Diagnosis: MDD  Secondary Diagnoses: Principal Problem:   GAD (generalized anxiety disorder) Active Problems:   Trichomoniasis   Cocaine use   ETOH abuse   Marijuana dependence (HCC)   Current Medications:  Current Facility-Administered Medications  Medication Dose Route Frequency Provider Last Rate Last Dose  . acetaminophen (TYLENOL) tablet 650 mg  650 mg Oral Q6H PRN Patrecia Pour, NP      . alum & mag hydroxide-simeth (MAALOX/MYLANTA) 200-200-20 MG/5ML suspension 30 mL  30 mL Oral PRN Patrecia Pour, NP      . ARIPiprazole (ABILIFY) tablet 10 mg  10 mg Oral Daily Patrecia Pour, NP   10 mg at 03/25/16 0815  . busPIRone (BUSPAR) tablet 10 mg  10 mg Oral BID Linard Millers, MD   10 mg at 03/25/16 0816  . hydrOXYzine (ATARAX/VISTARIL) tablet 50 mg  50 mg Oral Q6H PRN Niel Hummer, NP   50 mg at 03/24/16 1536  . ibuprofen (ADVIL,MOTRIN) tablet 600 mg  600 mg Oral Q8H PRN Patrecia Pour, NP   600 mg at 03/24/16 2130  . LORazepam (ATIVAN) tablet 1 mg  1 mg Oral Q6H PRN Linard Millers, MD   1 mg at 03/25/16 0817  . magnesium hydroxide (MILK OF MAGNESIA) suspension 30 mL  30 mL Oral Daily PRN Patrecia Pour, NP      . metroNIDAZOLE (FLAGYL) tablet 500 mg  500 mg Oral Q12H Patrecia Pour, NP   500 mg at 03/25/16 0816  . nicotine (NICODERM CQ - dosed in mg/24 hours) patch 14 mg  14 mg Transdermal Daily Linard Millers, MD   14 mg at 03/25/16 0815  . ondansetron (ZOFRAN) tablet 4 mg  4 mg Oral Q8H PRN Patrecia Pour, NP      . traZODone (DESYREL) tablet 50 mg  50 mg Oral QHS PRN Patrecia Pour, NP   50 mg at 03/24/16 2130   PTA Medications: Prescriptions Prior to Admission  Medication Sig Dispense Refill Last Dose  . ARIPiprazole (ABILIFY) 10 MG tablet Take 10 mg by mouth daily.   0 03/20/2016 at Unknown time    Patient Stressors:  Substance abuse  Patient Strengths: Average or above average intelligence Communication skills  Treatment Modalities: Medication Management, Group therapy, Case management,  1 to 1 session with clinician, Psychoeducation, Recreational therapy.   Physician Treatment Plan for Primary Diagnosis: MDD Long Term Goal(s): Improvement in symptoms so as ready for discharge Improvement in symptoms so as ready for discharge   Short Term Goals: Ability to demonstrate self-control will improve Ability to identify changes in lifestyle to reduce recurrence of condition will improve  Medication Management: Evaluate patient's response, side effects, and tolerance of medication regimen.  Therapeutic Interventions: 1 to 1 sessions, Unit Group sessions and Medication administration.  Evaluation of Outcomes: Met  Physician Treatment Plan for Secondary Diagnosis: Principal Problem:   GAD (generalized anxiety disorder) Active Problems:   Trichomoniasis   Cocaine use   ETOH abuse   Marijuana dependence ([place])  Long Term Goal(s): Improvement in symptoms so as ready for discharge Improvement in symptoms so as ready for discharge   Short Term Goals: Ability to demonstrate self-control will improve Ability to identify changes in lifestyle to reduce recurrence of condition will improve     Medication Management: Evaluate patient's response, side effects, and tolerance of medication regimen.  Therapeutic Interventions: 1 to 1 sessions, Unit Group sessions and Medication administration.  Evaluation of Outcomes: Met   RN Treatment Plan for Primary Diagnosis: MDD Long Term Goal(s): Knowledge of disease and therapeutic regimen to maintain health will improve  Short Term Goals: Ability to remain free from injury will improve, Ability to disclose and discuss suicidal ideas and Ability to identify and develop effective coping behaviors will improve  Medication Management: RN will administer medications as  ordered by provider, will assess and evaluate patient's response and provide education to patient for prescribed medication. RN will report any adverse and/or side effects to prescribing provider.  Therapeutic Interventions: 1 on 1 counseling sessions, Psychoeducation, Medication administration, Evaluate responses to treatment, Monitor vital signs and CBGs as ordered, Perform/monitor CIWA, COWS, AIMS and Fall Risk screenings as ordered, Perform wound care treatments as ordered.  Evaluation of Outcomes: Met   LCSW Treatment Plan for Primary Diagnosis: MDD Long Term Goal(s): Safe transition to appropriate next level of care at discharge, Engage patient in therapeutic group addressing interpersonal concerns.  Short Term Goals: Engage patient in aftercare planning with referrals and resources, Facilitate patient progression through stages of change regarding substance use diagnoses and concerns and Identify triggers associated with mental health/substance abuse issues  Therapeutic Interventions: Assess for all discharge needs, 1 to 1 time with Social worker, Explore available resources and support systems, Assess for adequacy in community support network, Educate family and significant other(s) on suicide prevention, Complete Psychosocial Assessment, Interpersonal group therapy.  Evaluation of Outcomes: Met  Progress in Treatment: Attending groups: Yes Participating in groups:Yes Taking medication as prescribed: Yes. Toleration medication: Yes. Family/Significant other contact made: SPE completed with pt's mother.  Patient understands diagnosis: Yes. Discussing patient identified problems/goals with staff: Yes. Medical problems stabilized or resolved: Yes. Denies suicidal/homicidal ideation: Yes, self report.  Issues/concerns per patient self-inventory: No. Other: n/a  New problem(s) identified: No, Describe:  n/a  New Short Term/Long Term Goal(s): Medication stabilization, development of  appropriate and safe aftercare plan.   Discharge Plan or Barriers: Pt has appt with her DBT counselor at 1pm today right after discharge. [facility] cannot get patient into office until January 2017 but put her on cancellation list. Pt will need 2 refills with prescription to carry her over to scheduled appt.   Reason for Continuation of Hospitalization: none  Estimated Length of Stay: d/c today.   Attendees: Patient: 03/25/2016 8:34 AM  Physician: Dr. Odelia Gage MD 03/25/2016 8:34 AM  Nursing: Carlynn Purl RN 03/25/2016 8:34 AM  RN Care Manager: Lars Pinks CM 03/25/2016 8:34 AM  Social Worker: Maxie Better, LCSW 03/25/2016 8:34 AM  Recreational Therapist:  03/25/2016 8:34 AM  Other: Catalina Pizza NP; Samuel Jester NP 03/25/2016 8:34 AM  Other:  03/25/2016 8:34 AM  Other: 03/25/2016 8:34 AM    Scribe for Treatment Team: [place], LCSW 03/25/2016 8:34 AM

## 2017-07-16 ENCOUNTER — Other Ambulatory Visit: Payer: Self-pay

## 2017-07-16 ENCOUNTER — Emergency Department (HOSPITAL_COMMUNITY)
Admission: EM | Admit: 2017-07-16 | Discharge: 2017-07-17 | Disposition: A | Discharge status: Other Acute Inpatient Hospital | Payer: BLUE CROSS/BLUE SHIELD | Attending: Emergency Medicine | Admitting: Emergency Medicine

## 2017-07-16 ENCOUNTER — Emergency Department (HOSPITAL_COMMUNITY): Payer: BLUE CROSS/BLUE SHIELD

## 2017-07-16 DIAGNOSIS — F419 Anxiety disorder, unspecified: Secondary | ICD-10-CM | POA: Diagnosis not present

## 2017-07-16 DIAGNOSIS — F17210 Nicotine dependence, cigarettes, uncomplicated: Secondary | ICD-10-CM | POA: Insufficient documentation

## 2017-07-16 DIAGNOSIS — F99 Mental disorder, not otherwise specified: Secondary | ICD-10-CM | POA: Diagnosis present

## 2017-07-16 DIAGNOSIS — F332 Major depressive disorder, recurrent severe without psychotic features: Secondary | ICD-10-CM | POA: Diagnosis not present

## 2017-07-16 DIAGNOSIS — F22 Delusional disorders: Secondary | ICD-10-CM | POA: Insufficient documentation

## 2017-07-16 DIAGNOSIS — R45850 Homicidal ideations: Secondary | ICD-10-CM | POA: Diagnosis not present

## 2017-07-16 DIAGNOSIS — R45851 Suicidal ideations: Secondary | ICD-10-CM | POA: Diagnosis not present

## 2017-07-16 DIAGNOSIS — Z79899 Other long term (current) drug therapy: Secondary | ICD-10-CM | POA: Insufficient documentation

## 2017-07-16 DIAGNOSIS — F411 Generalized anxiety disorder: Secondary | ICD-10-CM | POA: Diagnosis present

## 2017-07-16 LAB — COMPREHENSIVE METABOLIC PANEL
ALBUMIN: 5.1 g/dL — AB (ref 3.5–5.0)
ALK PHOS: 87 U/L (ref 38–126)
ALT: 13 U/L — AB (ref 14–54)
AST: 22 U/L (ref 15–41)
Anion gap: 12 (ref 5–15)
BUN: 8 mg/dL (ref 6–20)
CALCIUM: 10 mg/dL (ref 8.9–10.3)
CHLORIDE: 105 mmol/L (ref 101–111)
CO2: 23 mmol/L (ref 22–32)
CREATININE: 0.82 mg/dL (ref 0.44–1.00)
GFR calc Af Amer: >60 mL/min (ref >60)
GFR calc non Af Amer: >60 mL/min (ref >60)
GLUCOSE: 101 mg/dL — AB (ref 65–99)
Potassium: 4.3 mmol/L (ref 3.5–5.1)
SODIUM: 140 mmol/L (ref 135–145)
Total Bilirubin: 1.2 mg/dL (ref 0.3–1.2)
Total Protein: 8.5 g/dL — ABNORMAL HIGH (ref 6.5–8.1)

## 2017-07-16 LAB — RAPID URINE DRUG SCREEN, HOSP PERFORMED
AMPHETAMINES: POSITIVE — AB
BENZODIAZEPINES: NOT DETECTED
Barbiturates: NOT DETECTED
COCAINE: NOT DETECTED
OPIATES: NOT DETECTED
Tetrahydrocannabinol: POSITIVE — AB

## 2017-07-16 LAB — CBC
HCT: 45.9 % (ref 36.0–46.0)
Hemoglobin: 15.6 g/dL — ABNORMAL HIGH (ref 12.0–15.0)
MCH: 29.7 pg (ref 26.0–34.0)
MCHC: 34 g/dL (ref 30.0–36.0)
MCV: 87.4 fL (ref 78.0–100.0)
PLATELETS: 444 10*3/uL — AB (ref 150–400)
RBC: 5.25 MIL/uL — AB (ref 3.87–5.11)
RDW: 12.8 % (ref 11.5–15.5)
WBC: 18.4 10*3/uL — AB (ref 4.0–10.5)

## 2017-07-16 LAB — ETHANOL: Alcohol, Ethyl (B): <10 mg/dL (ref <10)

## 2017-07-16 LAB — POC URINE PREG, ED: Preg Test, Ur: NEGATIVE

## 2017-07-16 MED ORDER — TRAZODONE HCL 50 MG PO TABS
50.0000 mg | ORAL_TABLET | Freq: Every day | ORAL | Status: DC
  Administered 2017-07-16: 50 mg via ORAL
  Filled 2017-07-16: qty 1

## 2017-07-16 MED ORDER — BENZTROPINE MESYLATE 1 MG PO TABS
1.0000 mg | ORAL_TABLET | Freq: Every day | ORAL | Status: DC
  Administered 2017-07-17: 1 mg via ORAL
  Filled 2017-07-16 (×2): qty 1

## 2017-07-16 MED ORDER — HALOPERIDOL 1 MG PO TABS
2.0000 mg | ORAL_TABLET | Freq: Two times a day (BID) | ORAL | Status: DC
  Administered 2017-07-16 – 2017-07-17 (×2): 2 mg via ORAL
  Filled 2017-07-16 (×2): qty 2
  Filled 2017-07-16: qty 1

## 2017-07-16 NOTE — ED Notes (Signed)
Family at bedside. 

## 2017-07-16 NOTE — BH Assessment (Signed)
[facility] Assessment Progress Note  Case was staffed with Shaune PollackLord DNP who recommended patient be monitored and observed. Patient will be seen by psychiatry in the a.m.

## 2017-07-16 NOTE — ED Notes (Signed)
Pt said to staff member, "I feel like you are Jesus."  Staff member asked her if she would like to take a shower and she said, "I want to cleanse my soul."  She said that she is not going to take medications.

## 2017-07-16 NOTE — ED Notes (Signed)
Bed: [facility]42 Expected date:  Expected time:  Means of arrival:  Comments: Hold for 28 

## 2017-07-16 NOTE — ED Provider Notes (Signed)
[facility]-EMERGENCY DEPT Provider Note   CSN: [id number] Arrival date & time: 07/16/17  1436     History   Chief Complaint Chief Complaint  Patient presents with  . Psychiatric Evaluation    HPI Kathy Hudson is a 22 y.o. female past medical history of polysubstance abuse, MDD, bipolar disorder, borderline personality disorder who presents for evaluation of psychiatric evaluation.  Patient presents with mom who picked her up from patient's aunt's house in [place] today.  Mom reports that patient has been living with patient's aunt for the last few months.  Mom reports that aunt called her and stated that patient had been acting erratically and they were concerned about her safety.  Mom went to pick patient up from the [place].  Mom states that patient was acting erratically and was talking incoherently about signs that she was trying to determine.  Mom reports that on the way home, patient had an episode where she started screaming and stating that her mom was the devil and that she saw the devil beside her mom.  Mom states that then she started screaming that hell\ was coming for her and that "she wanted to go to heaven."  Mom was afraid that patient was going to open the car door and tried to jump out.  Patient admits that she has not been taking her antipsychotic medications for the last 6 months.  Additionally, patient has noted no outpatient therapy for the last 6 months.  Patient reports that she has been seeing signs everywhere.  She states that she has been saying that the reading is backward and that she is wondering if there is a secret message and the signs for her.  Additionally, patient states that she is watching for the " people to come and give her the news."When I asked who the people are or what the news is, she is unable to elaborate and states that she "does not know until she gets the message."  Patient denies any cocaine, heroin, marijuana use.  She  reports that she drinks alcohol a few days ago.  She does not endorse smoking cigarettes.  She denies SI/HI.  The history is provided by the patient.    Past Medical History:  Diagnosis Date  . ADHD (attention deficit hyperactivity disorder)     Patient Active Problem List   Diagnosis Date Noted  . Trichomoniasis 03/24/2016  . Cocaine use 03/24/2016  . ETOH abuse 03/24/2016  . Marijuana dependence (HCC) 03/24/2016  . MDD (major depressive disorder), recurrent severe, without psychosis (HCC) 03/23/2016  . Psychoactive substance-induced mood disorder (HCC) 03/23/2016  . Major depressive disorder, recurrent, severe without psychotic behavior (HCC) 03/22/2016  . GAD (generalized anxiety disorder) 03/22/2016  . BV (bacterial vaginosis)   . Suicidal ideations   . ADHD (attention deficit hyperactivity disorder) 08/05/2014    No past surgical history on file.  OB History    No data available       Home Medications    Prior to Admission medications   Medication Sig Start Date End Date Taking? Authorizing Provider  busPIRone (BUSPAR) 10 MG tablet Take 1 tablet (10 mg total) by mouth 2 (two) times daily. 03/25/16  Yes Adonis Brook, NP  ARIPiprazole (ABILIFY) 10 MG tablet Take 1 tablet (10 mg total) by mouth daily. Patient not taking: Reported on 07/16/2017 03/26/16   Adonis Brook, NP  hydrOXYzine (ATARAX/VISTARIL) 50 MG tablet Take 1 tablet (50 mg total) by mouth every 6 (six) hours as  needed for anxiety. Patient not taking: Reported on 07/16/2017 03/25/16   Adonis Brook, NP  metroNIDAZOLE (FLAGYL) 500 MG tablet Take 1 tablet (500 mg total) by mouth every 12 (twelve) hours. Patient not taking: Reported on 07/16/2017 03/25/16   Adonis Brook, NP  nicotine (NICODERM CQ - DOSED IN MG/24 HOURS) 14 mg/24hr patch Place 1 patch (14 mg total) onto the skin daily. Patient not taking: Reported on 07/16/2017 03/26/16   Adonis Brook, NP  traZODone (DESYREL) 50 MG tablet Take 1 tablet (50  mg total) by mouth at bedtime as needed for sleep. Patient not taking: Reported on 07/16/2017 03/25/16   Adonis Brook, NP    Family History No family history on file.  Social History Social History   Tobacco Use  . Smoking status: Light Tobacco Smoker    Types: Cigarettes  . Smokeless tobacco: Never Used  Substance Use Topics  . Alcohol use: No    Alcohol/week: 0.0 oz  . Drug use: Yes    Types: Benzodiazepines, Marijuana     Allergies   Patient has no known allergies.   Review of Systems Review of Systems  Constitutional: Negative for fever.  Respiratory: Negative for cough and shortness of breath.   Cardiovascular: Negative for chest pain.  Gastrointestinal: Negative for abdominal pain, nausea and vomiting.  Genitourinary: Negative for dysuria and hematuria.  Neurological: Negative for headaches.  Psychiatric/Behavioral: Positive for confusion and hallucinations. Negative for self-injury and suicidal ideas.     Physical Exam Updated Vital Signs BP (!) 142/94 (BP Location: Left Arm) Comment: Rn Latricia notified  Pulse (!) 108   Temp 97.8 F (36.6 C) (Oral)   Resp 20   Ht 5\' 4"  (1.626 m)   Wt 49.9 kg (110 lb)   LMP 07/09/2017   SpO2 99%   BMI 18.88 kg/m   Physical Exam  Constitutional: She is oriented to person, place, and time. She appears well-developed and well-nourished.  HENT:  Head: Normocephalic and atraumatic.  Mouth/Throat: Oropharynx is clear and moist and mucous membranes are normal.  Eyes: Conjunctivae, EOM and lids are normal. Pupils are equal, round, and reactive to light.  Neck: Full passive range of motion without pain.  Cardiovascular: Normal rate, regular rhythm, normal heart sounds and normal pulses. Exam reveals no gallop and no friction rub.  No murmur heard. Pulmonary/Chest: Effort normal and breath sounds normal.  Abdominal: Soft. Normal appearance. There is no tenderness. There is no rigidity and no guarding.  Musculoskeletal:  Normal range of motion.  Neurological: She is alert and oriented to person, place, and time.  Skin: Skin is warm and dry. Capillary refill takes less than 2 seconds.  Psychiatric: She has a normal mood and affect. Her speech is rapid and/or pressured. She is agitated. Thought content is paranoid.  Patient avoids eye contact. She starts talking in quiet, hushed tones when talking about the "secret signs."   Nursing note and vitals reviewed.    ED Treatments / Results  Labs (all labs ordered are listed, but only abnormal results are displayed) Labs Reviewed  COMPREHENSIVE METABOLIC PANEL - Abnormal; Notable for the following components:      Result Value   Glucose, Bld 101 (*)    Total Protein 8.5 (*)    Albumin 5.1 (*)    ALT 13 (*)    All other components within normal limits  CBC - Abnormal; Notable for the following components:   WBC 18.4 (*)    RBC 5.25 (*)  Hemoglobin 15.6 (*)    Platelets 444 (*)    All other components within normal limits  RAPID URINE DRUG SCREEN, HOSP PERFORMED - Abnormal; Notable for the following components:   Amphetamines POSITIVE (*)    Tetrahydrocannabinol POSITIVE (*)    All other components within normal limits  URINALYSIS, ROUTINE W REFLEX MICROSCOPIC - Abnormal; Notable for the following components:   APPearance CLOUDY (*)    pH 9.0 (*)    Hgb urine dipstick SMALL (*)    Bacteria, UA MANY (*)    Squamous Epithelial / LPF TOO NUMEROUS TO COUNT (*)    All other components within normal limits  ETHANOL  CBC WITH DIFFERENTIAL/PLATELET  POC URINE PREG, ED  I-STAT BETA HCG BLOOD, ED ([facility], [facility], [facility] ONLY)    EKG  EKG Interpretation None       Radiology Dg Chest 2 View  Result Date: 07/17/2017 CLINICAL DATA:  Leukocytosis EXAM: CHEST - 2 VIEW COMPARISON:  None. FINDINGS: The heart size and mediastinal contours are within normal limits. Both lungs are clear. The visualized skeletal structures are unremarkable. IMPRESSION: No active  cardiopulmonary disease. Electronically Signed   By: Jasmine Pang M.D.   On: 07/17/2017 00:00    Procedures Procedures (including critical care time)  Medications Ordered in ED Medications  haloperidol (HALDOL) tablet 2 mg (2 mg Oral Given 07/16/17 2143)  benztropine (COGENTIN) tablet 1 mg (1 mg Oral Refused 07/16/17 1815)  traZODone (DESYREL) tablet 50 mg (50 mg Oral Given 07/16/17 2143)     Initial Impression / Assessment and Plan / ED Course  I have reviewed the triage vital signs and the nursing notes.  Pertinent labs & imaging results that were available during my care of the patient were reviewed by me and considered in my medical decision making (see chart for details).     22 year old female past medical history bipolar, borderline personality disorder, depression who presents for evaluation of psychiatric evaluation.  Brought in by mother after she received a call from patient's aunt saying she was acting erratically.  Mom reports an episode in the car where patient seem to space out, stated that she saw the devil and that he will was coming for her.  Mom reports that patient stated "I want to go to heaven."  Mom was concerned that patient will try to open the door to kill herself.  Mom reports that she has had reports of erratic behavior.  Mom reports that she has never seen an episode like today and mom is worried about both her safety and patient safety.  Mom does not feel safe having patient go home and states that she is concerned that patient will do something to hurt herself.  On my evaluation, patient denies any SI/HI but patient does appear to be very paranoid.  She states that she is "looking for the secret messages and the signs."  She also reports that she has been a looking for the people to come "give her the news that."  Patient states that the signs that she sees are all around her and that time the reading is backward and she has to determine the secret messages.  Based on  patient's paranoid behavior and discussions with mom, feels that patient meets criteria for IVC.  Mom agrees that she feels that patient is both a risk to herself and to mom.  We will plan for IVC paperwork, medical clearance labs, TTS evaluation.  CBC shows leukocytosis of 18.4.  Hemoglobin  hematocrit are stable.  Rapid urine drug screen is positive for amphetamines, marijuana.  CMP shows slight hyperglycemia otherwise unremarkable.  UA shows hemoglobin, there is bacteria but there is squamous epithelium.  No indication of acute infectious etiology.  Urine pregnancy is negative.  Chest x-ray is negative for any acute abnormality.  I suspect that patient's leukocytosis may be attributed to either psychosis or recent use of amphetamines.  Given that patient has negative UA, negative chest x-ray and has a benign abdominal exam do not consider leukocytosis and indication of infectious source.  Patient is medically cleared.  TTS has discussed with patient and is recommending observation in the ED for evaluation tomorrow morning.  Final Clinical Impressions(s) / ED Diagnoses   Final diagnoses:  Paranoid behavior [facility](HCC)    ED Discharge Orders    None       Maxwell CaulLayden, Franchot Pollitt A, PA-C 07/17/17 [id number]    Bethann BerkshireZammit, Joseph, MD 07/20/17 [phone]

## 2017-07-16 NOTE — ED Notes (Signed)
Per Pt's mother: Pt's mother reports she had to go pick her daughter up in [place]. Pt has a hx of bipolar schizophrenia ad borderline personality disorder.  Pt was talking to mother and suddenly went pale in the car and started screaming saying she was seeing the devil and that hell was coming for her and that she wanted to be in heaven.  Pt's mother stated that she was supposed to be on medication for her behavioral health, but that she doesn't believe pt has been taking anything.  Pt's mother also reports pt has a hx of drug usage, but is unaware when pt last used.

## 2017-07-16 NOTE — ED Notes (Signed)
Bed: WA27 Expected date:  Expected time:  Means of arrival:  Comments: 

## 2017-07-16 NOTE — BH Assessment (Addendum)
Assessment Note  Kathy Hudson is an 22 y.o. female that presents this date with IVC. Per IVC, " Mother reports concern for both her and parent's safety. Patient had a episode in front of her  mom today where patient was hallucinating and expressing, "I want to go to heaven." Mom was concerned that patient was going to open the door of the care and kill herself. Additionally, mom states that patient looked at mom and said "she was the devil and belonged to hell." Mom states that patient was not making sense with paranoid thoughts". Patient is observed to be impaired at the time of the assessment and seems to be responding to internal stimuli as evidenced by patient having a conversation with herself and pointing to different parts of the room. Patient renders limited information due to being impaired. Patient is observed to be lucid at times during the assessment then reverts back to where she is displaying active thought blocking and making delusional statements. Patient denies any S/I, H/I but states "she hears God and sees him everywhere." Patient is liable and is tearful at times followed by periods of laughter. Patient reports using methamphetamines three to four times a week with last use this date stating she used 1/2 gram. Patient is vague in reference to other substances or length of use. Per history patient was diagnosed with Bipolar in 2016 at [facility] in [place] but denies being on medications (per IVC). Patient was actively impaired at the time of assessment and rendered limited history. Information to complete assessment was obtained from admission notes. Per notes, patient said to staff member, "I feel like you are Jesus."  Staff member asked her if she would like to take a shower and she said, "I want to cleanse my soul."  She said that she is not going to take medications. Patient states that she does not need to be here, "I am here because I screamed when they tried to draw  my blood. I am not depressed." Patient is guarded and refuses to answer questions. Case was staffed with Shaune Pollack DNP who recommended patient be monitored and observed. Patient will be seen by psychiatry in the a.m.       Diagnosis:  F31.8  Bipolar, Methamphetamine use    Past Medical History:  Past Medical History:  Diagnosis Date  . ADHD (attention deficit hyperactivity disorder)     No past surgical history on file.  Family History: No family history on file.  Social History:  reports that she has been smoking cigarettes.  she has never used smokeless tobacco. She reports that she uses drugs. Drugs: Benzodiazepines and Marijuana. She reports that she does not drink alcohol.  Additional Social History:  Alcohol / Drug Use Pain Medications: See MAR Prescriptions: See MAR Over the Counter: See MAR History of alcohol / drug use?: Yes Longest period of sobriety (when/how long): 1 year 2015 Negative Consequences of Use: (Denies) Withdrawal Symptoms: (Denies) Substance #1 Name of Substance 1: Methamphetamine 1 - Age of First Use: 19 1 - Amount (size/oz): 1 gram 1 - Frequency: Three to four times a week 1 - Duration: Last year 1 - Last Use / Amount: 07/16/17 1/2 gram  CIWA: CIWA-Ar BP: 140/86 Pulse Rate: (!) 132 COWS:    Allergies: No Known Allergies  Home Medications:  (Not in a hospital admission)  OB/GYN Status:  Patient's last menstrual period was 07/09/2017.  General Assessment Data [facility] of [facility]: [facility] TTS Assessment: In  system Is this a Tele or Face-to-Face Assessment?: Face-to-Face Is this an Initial Assessment or a Re-assessment for this encounter?: Initial Assessment Marital status: Single Maiden name: NA Is patient pregnant?: Unknown Pregnancy Status: Unknown Living Arrangements: Parent Can pt return to current living arrangement?: Yes Admission Status: Involuntary Is patient capable of signing voluntary admission?: No Referral Source:  Other(EDP) Insurance type: Tax adviserBC/BS  Medical Screening Exam [facility] Walk-in ONLY) Medical Exam completed: Yes  Crisis Care Plan Living Arrangements: Parent Legal Guardian: (NA) Name of Psychiatrist: None Name of Therapist: None  Education Status Is patient currently in school?: No Is the patient employed, unemployed or receiving disability?: Unemployed  Risk to self with the past 6 months Suicidal Ideation: No Has patient been a risk to self within the past 6 months prior to admission? : No Suicidal Intent: No Has patient had any suicidal intent within the past 6 months prior to admission? : No Is patient at risk for suicide?: Yes(Per IVC) Suicidal Plan?: No Has patient had any suicidal plan within the past 6 months prior to admission? : No Access to Means: No What has been your use of drugs/alcohol within the last 12 months?: Current use Previous Attempts/Gestures: (Pt declines to answer) How many times?: (UTA) Other Self Harm Risks: (Unknown) Triggers for Past Attempts: Unknown Intentional Self Injurious Behavior: (UTA) Family Suicide History: (UTA) Recent stressful life event(s): (UTA) Persecutory voices/beliefs?: (UTA) Depression: (UTA) Depression Symptoms: (UTA) Substance abuse history and/or treatment for substance abuse?: Yes(Per notes) Suicide prevention information given to non-admitted patients: Not applicable  Risk to Others within the past 6 months Homicidal Ideation: (UTA) Does patient have any lifetime risk of violence toward others beyond the six months prior to admission? : (UTA) Thoughts of Harm to Others: (UTA) Current Homicidal Intent: (UTA) Current Homicidal Plan: (UTA) Access to Homicidal Means: (UTA) Identified Victim: (UTA) History of harm to others?: (UTA) Assessment of Violence: (UTA) Violent Behavior Description: (UTA) Does patient have access to weapons?: (UTA) Criminal Charges Pending?: (UTA) Does patient have a court date: (UTA) Is patient  on probation?: (UTA)  Psychosis Hallucinations: Auditory, Visual Delusions: None noted  Mental Status Report Appearance/Hygiene: In scrubs Eye Contact: Poor Motor Activity: Agitation, Restlessness Speech: Tangential, Word salad Level of Consciousness: Irritable Mood: Anxious, Labile Affect: Blunted Anxiety Level: Moderate Thought Processes: Thought Blocking Judgement: Impaired Orientation: Unable to assess Obsessive Compulsive Thoughts/Behaviors: Unable to Assess  Cognitive Functioning Concentration: Unable to Assess Memory: Unable to Assess Is patient IDD: No Is patient DD?: No Insight: Unable to Assess Impulse Control: Unable to Assess Appetite: (UTA) Have you had any weight changes? : (UTA) Sleep: (UTA) Total Hours of Sleep: (UTA) Vegetative Symptoms: (UTA)  ADLScreening [facility] Assessment Services) Patient's cognitive ability adequate to safely complete daily activities?: Yes Patient able to express need for assistance with ADLs?: Yes Independently performs ADLs?: Yes (appropriate for developmental age)  Prior Inpatient Therapy Prior Inpatient Therapy: No  Prior Outpatient Therapy Prior Outpatient Therapy: Yes Prior Therapy Dates: 2016(Per notes) Prior Therapy Facilty/Provider(s): [facility] Reason for Treatment: MH issues Does patient have an ACCT team?: No Does patient have Intensive In-House Services?  : No Does patient have [facility] services? : (UTA) Does patient have P4CC services?: No  ADL Screening (condition at time of admission) Patient's cognitive ability adequate to safely complete daily activities?: Yes Is the patient deaf or have difficulty hearing?: No Does the patient have difficulty seeing, even when wearing glasses/contacts?: No Does the patient have difficulty concentrating, remembering, or  making decisions?: Yes Patient able to express need for assistance with ADLs?: Yes Does the patient have difficulty  dressing or bathing?: No Independently performs ADLs?: Yes (appropriate for developmental age) Does the patient have difficulty walking or climbing stairs?: No Weakness of Legs: None Weakness of Arms/Hands: None  Home Assistive Devices/Equipment Home Assistive Devices/Equipment: None  Therapy Consults (therapy consults require a physician order) PT Evaluation Needed: No OT Evalulation Needed: No SLP Evaluation Needed: No Abuse/Neglect Assessment (Assessment to be complete while patient is alone) Physical Abuse: Denies Verbal Abuse: Denies Sexual Abuse: Denies Exploitation of patient/patient's resources: Denies Self-Neglect: Denies Values / Beliefs Cultural Requests During Hospitalization: None Spiritual Requests During Hospitalization: None Consults Spiritual Care Consult Needed: No Social Work Consult Needed: No Merchant navy officer (For Healthcare) Does Patient Have a Medical Advance Directive?: No Would patient like information on creating a medical advance directive?: No - Patient declined    Additional Information 1:1 In Past 12 Months?: No CIRT Risk: No Elopement Risk: No Does patient have medical clearance?: Yes     Disposition: Case was staffed with Shaune Pollack DNP who recommended patient be monitored and observed. Patient will be seen by psychiatry in the a.m. Disposition Initial Assessment Completed for this Encounter: Yes Disposition of Patient: (Other) Patient refused recommended treatment: Yes Mode of transportation if patient is discharged?: (Unknown) Patient referred to: Other (Comment)(Observe and monitor for safety)  On Site Evaluation by:   Reviewed with Physician:    Alfredia Ferguson 07/16/2017 6:59 PM

## 2017-07-16 NOTE — ED Notes (Signed)
Pt states that she does not need to be here, "I am here because I screamed when they tried to draw my blood. I am not depressed."  Denies SI/HI/AVH. Pt is cooperative, but guarded. Did not want anything to drink or any snacks.

## 2017-07-16 NOTE — ED Notes (Signed)
To x-ray via wheelchair. Escorted by [employer].

## 2017-07-16 NOTE — ED Triage Notes (Signed)
Per Pt:  Pt reports she is not having any pain but is anxious and tearful. Pt denies any SI, HI. Pt denies hearing voices, but is "seeing signs".  Pt denies alcohol but reports she did some crystal meth yesterday.  Pt asked RN to unplug the TV.

## 2017-07-16 NOTE — ED Notes (Signed)
Writer attempted to collect blood work, unable to obtained to due to pt refuses and is deathly afraid of needles.  Writer informed PA

## 2017-07-16 NOTE — ED Notes (Signed)
Pt is currently being IVC'd and refusing blood draw.  PA told RN it is acceptable to have security help hold for blood draw. Pt screaming at RN "I'M TWEAKING FROM CRYSTAL METH I CAN'T DO THIS!"

## 2017-07-16 NOTE — ED Notes (Signed)
EMT and Security completed blood draw. No force used, Security held patients hands while EMT completed pt blood draw. Pt tearful and screaming during process.

## 2017-07-16 NOTE — ED Notes (Signed)
Pt A&O x 3, no distress noted, calm & cooperative, visiting with family at present.  Monitoring for safety, Q 15 min checks in effect. 

## 2017-07-17 ENCOUNTER — Encounter (HOSPITAL_COMMUNITY): Payer: Self-pay

## 2017-07-17 ENCOUNTER — Other Ambulatory Visit: Payer: Self-pay

## 2017-07-17 ENCOUNTER — Inpatient Hospital Stay (HOSPITAL_COMMUNITY)
Admission: AD | Admit: 2017-07-17 | Discharge: 2017-07-20 | DRG: 885 | Disposition: A | Discharge status: Home / Self Care | Payer: BLUE CROSS/BLUE SHIELD | Attending: Psychiatry | Admitting: Psychiatry

## 2017-07-17 DIAGNOSIS — F1290 Cannabis use, unspecified, uncomplicated: Secondary | ICD-10-CM | POA: Diagnosis not present

## 2017-07-17 DIAGNOSIS — F332 Major depressive disorder, recurrent severe without psychotic features: Secondary | ICD-10-CM

## 2017-07-17 DIAGNOSIS — F22 Delusional disorders: Secondary | ICD-10-CM | POA: Diagnosis not present

## 2017-07-17 DIAGNOSIS — F1390 Sedative, hypnotic, or anxiolytic use, unspecified, uncomplicated: Secondary | ICD-10-CM | POA: Diagnosis not present

## 2017-07-17 DIAGNOSIS — F419 Anxiety disorder, unspecified: Secondary | ICD-10-CM | POA: Diagnosis present

## 2017-07-17 DIAGNOSIS — R443 Hallucinations, unspecified: Secondary | ICD-10-CM | POA: Diagnosis not present

## 2017-07-17 DIAGNOSIS — F17210 Nicotine dependence, cigarettes, uncomplicated: Secondary | ICD-10-CM | POA: Diagnosis present

## 2017-07-17 DIAGNOSIS — G3184 Mild cognitive impairment, so stated: Secondary | ICD-10-CM | POA: Diagnosis not present

## 2017-07-17 DIAGNOSIS — R450 Nervousness: Secondary | ICD-10-CM | POA: Diagnosis not present

## 2017-07-17 DIAGNOSIS — F1994 Other psychoactive substance use, unspecified with psychoactive substance-induced mood disorder: Secondary | ICD-10-CM

## 2017-07-17 DIAGNOSIS — R45851 Suicidal ideations: Secondary | ICD-10-CM

## 2017-07-17 DIAGNOSIS — R4587 Impulsiveness: Secondary | ICD-10-CM

## 2017-07-17 DIAGNOSIS — Z79899 Other long term (current) drug therapy: Secondary | ICD-10-CM | POA: Diagnosis not present

## 2017-07-17 DIAGNOSIS — R45850 Homicidal ideations: Secondary | ICD-10-CM

## 2017-07-17 DIAGNOSIS — G4700 Insomnia, unspecified: Secondary | ICD-10-CM

## 2017-07-17 DIAGNOSIS — F15251 Other stimulant dependence with stimulant-induced psychotic disorder with hallucinations: Secondary | ICD-10-CM | POA: Diagnosis not present

## 2017-07-17 DIAGNOSIS — Z915 Personal history of self-harm: Secondary | ICD-10-CM

## 2017-07-17 DIAGNOSIS — F1514 Other stimulant abuse with stimulant-induced mood disorder: Secondary | ICD-10-CM | POA: Diagnosis present

## 2017-07-17 DIAGNOSIS — F1594 Other stimulant use, unspecified with stimulant-induced mood disorder: Secondary | ICD-10-CM | POA: Diagnosis not present

## 2017-07-17 DIAGNOSIS — F909 Attention-deficit hyperactivity disorder, unspecified type: Secondary | ICD-10-CM | POA: Diagnosis present

## 2017-07-17 DIAGNOSIS — F0630 Mood disorder due to known physiological condition, unspecified: Secondary | ICD-10-CM | POA: Diagnosis not present

## 2017-07-17 LAB — CBC WITH DIFFERENTIAL/PLATELET
Basophils Absolute: 0.1 10*3/uL (ref 0.0–0.1)
Basophils Relative: 1 %
Eosinophils Absolute: 0.3 10*3/uL (ref 0.0–0.7)
Eosinophils Relative: 3 %
HCT: 41.2 % (ref 36.0–46.0)
Hemoglobin: 13.9 g/dL (ref 12.0–15.0)
LYMPHS ABS: 3.7 10*3/uL (ref 0.7–4.0)
LYMPHS PCT: 30 %
MCH: 30.1 pg (ref 26.0–34.0)
MCHC: 33.7 g/dL (ref 30.0–36.0)
MCV: 89.2 fL (ref 78.0–100.0)
MONO ABS: 1.2 10*3/uL — AB (ref 0.1–1.0)
MONOS PCT: 10 %
Neutro Abs: 7.3 10*3/uL (ref 1.7–7.7)
Neutrophils Relative %: 58 %
Platelets: 383 10*3/uL (ref 150–400)
RBC: 4.62 MIL/uL (ref 3.87–5.11)
RDW: 13.1 % (ref 11.5–15.5)
WBC: 12.6 10*3/uL — AB (ref 4.0–10.5)

## 2017-07-17 LAB — URINALYSIS, ROUTINE W REFLEX MICROSCOPIC
Bilirubin Urine: NEGATIVE
Glucose, UA: NEGATIVE mg/dL
KETONES UR: NEGATIVE mg/dL
Leukocytes, UA: NEGATIVE
Nitrite: NEGATIVE
PH: 9 — AB (ref 5.0–8.0)
PROTEIN: NEGATIVE mg/dL
Specific Gravity, Urine: 1.006 (ref 1.005–1.030)

## 2017-07-17 MED ORDER — HYDROXYZINE HCL 50 MG PO TABS
50.0000 mg | ORAL_TABLET | Freq: Four times a day (QID) | ORAL | Status: DC | PRN
  Administered 2017-07-17 – 2017-07-18 (×2): 50 mg via ORAL
  Filled 2017-07-17 (×3): qty 1

## 2017-07-17 MED ORDER — LORAZEPAM 1 MG PO TABS
1.0000 mg | ORAL_TABLET | Freq: Once | ORAL | Status: AC
  Administered 2017-07-17: 1 mg via ORAL
  Filled 2017-07-17: qty 1

## 2017-07-17 MED ORDER — ACETAMINOPHEN 325 MG PO TABS: 650.0000 mg | ORAL_TABLET | Freq: Four times a day (QID) | ORAL | Status: DC | PRN

## 2017-07-17 MED ORDER — TRAZODONE HCL 50 MG PO TABS
50.0000 mg | ORAL_TABLET | Freq: Every day | ORAL | Status: DC
  Filled 2017-07-17 (×3): qty 1

## 2017-07-17 MED ORDER — NICOTINE 21 MG/24HR TD PT24
21.0000 mg | MEDICATED_PATCH | Freq: Once | TRANSDERMAL | Status: DC
  Administered 2017-07-17: 21 mg via TRANSDERMAL
  Filled 2017-07-17: qty 1

## 2017-07-17 MED ORDER — MAGNESIUM HYDROXIDE 400 MG/5ML PO SUSP: 30.0000 mL | Freq: Every day | ORAL | Status: DC | PRN

## 2017-07-17 MED ORDER — HALOPERIDOL 1 MG PO TABS
2.0000 mg | ORAL_TABLET | Freq: Two times a day (BID) | ORAL | Status: DC
  Administered 2017-07-17 – 2017-07-18 (×2): 2 mg via ORAL
  Filled 2017-07-17 (×7): qty 2

## 2017-07-17 MED ORDER — GABAPENTIN 100 MG PO CAPS
200.0000 mg | ORAL_CAPSULE | Freq: Two times a day (BID) | ORAL | Status: DC
  Administered 2017-07-17: 200 mg via ORAL
  Filled 2017-07-17: qty 2

## 2017-07-17 MED ORDER — NICOTINE 21 MG/24HR TD PT24
21.0000 mg | MEDICATED_PATCH | Freq: Every day | TRANSDERMAL | Status: DC
Start: 2017-07-17 — End: 2017-07-20
  Administered 2017-07-18 – 2017-07-20 (×3): 21 mg via TRANSDERMAL
  Filled 2017-07-17 (×6): qty 1

## 2017-07-17 MED ORDER — ALUM & MAG HYDROXIDE-SIMETH 200-200-20 MG/5ML PO SUSP: 30.0000 mL | ORAL | Status: DC | PRN

## 2017-07-17 MED ORDER — BENZTROPINE MESYLATE 0.5 MG PO TABS: 0.5000 mg | ORAL_TABLET | Freq: Two times a day (BID) | ORAL | Status: DC

## 2017-07-17 MED ORDER — BENZTROPINE MESYLATE 0.5 MG PO TABS
0.5000 mg | ORAL_TABLET | Freq: Two times a day (BID) | ORAL | Status: DC
  Administered 2017-07-17 – 2017-07-18 (×2): 0.5 mg via ORAL
  Filled 2017-07-17 (×7): qty 1

## 2017-07-17 MED ORDER — GABAPENTIN 100 MG PO CAPS
200.0000 mg | ORAL_CAPSULE | Freq: Two times a day (BID) | ORAL | Status: DC
  Administered 2017-07-17 – 2017-07-18 (×2): 200 mg via ORAL
  Filled 2017-07-17 (×7): qty 2

## 2017-07-17 NOTE — Tx Team (Signed)
Initial Treatment Plan 07/17/2017 5:40 PM Johna RolesMorgan Verry VOZ:[id number]    PATIENT STRESSORS: Financial difficulties Substance abuse   PATIENT STRENGTHS: Wellsite geologistCommunication skills General fund of knowledge Physical Health   PATIENT IDENTIFIED PROBLEMS: Psychosis  Substance abuse  "Realizing that no one is out to get me"  "Be happy"               DISCHARGE CRITERIA:  Improved stabilization in mood, thinking, and/or behavior Verbal commitment to aftercare and medication compliance Withdrawal symptoms are absent or subacute and managed without 24-hour nursing intervention  PRELIMINARY DISCHARGE PLAN: Outpatient therapy Medication management  PATIENT/FAMILY INVOLVEMENT: This treatment plan has been presented to and reviewed with the patient, Johna RolesMorgan Scheeler.  The patient and family have been given the opportunity to ask questions and make suggestions.  Levin BaconHeather V Keerthi Hazell, RN 07/17/2017, 5:40 PM

## 2017-07-17 NOTE — Progress Notes (Signed)
Kathy Hudson is a 22 year old female being admitted involuntarily to [facility] from [facility].  She came in to the ED under IVC initiated by her mother for psychosis and fear that she was going to harm herself.  [facility] she was labile but cooperative.  She denies any SI/HI or A/V hallucinations.  She stated that she has been using meth on a regular basis and does suffer from paranoia at times (thinking people are out to get her).  She denies any pain or discomfort and appears to be in no physical distress.  Her pulse was elevated and encouraged her to rest and drink plenty of fluids.  Recheck noted lower pulse and reported to assigned RN that she will need recheck as well.  Oriented her to the unit.  Admission paperwork completed and signed.  Belongings searched and secured in locker # 22, no contraband found.  Skin assessment completed and no skin issues noted.  Q 15 minute checks initiated for safety.  We will continue to monitor the progress towards her goals.

## 2017-07-17 NOTE — ED Notes (Signed)
Lab called to add UA with micro to urine in lab.

## 2017-07-17 NOTE — ED Notes (Signed)
In[facility] patient [facility]  She called her mother and is very tearful and anxious.

## 2017-07-17 NOTE — ED Notes (Signed)
[place]ed [place] to [place], RN.  [facility]

## 2017-07-17 NOTE — ED Notes (Signed)
Lab called for CBC blood draw.

## 2017-07-17 NOTE — ED Notes (Signed)
Discharge note:  Patient is tearful, however, cooperative.  She has been on the phone with her mother a few times and became loud at one point.  Patient states that she does not want help with her substance abuse problem.  Explained to patient that she is under IVC and considered a harm to herself and others.  Patient has poor insight; she denies any thoughts of self harm.

## 2017-07-17 NOTE — ED Notes (Signed)
[facility]

## 2017-07-17 NOTE — Progress Notes (Signed)
Patient did not attend wrap up group. 

## 2017-07-17 NOTE — Consult Note (Addendum)
[place] Psychiatry Consult   Reason for Consult:  Threats to kill herself Referring Physician:  EDP Patient Identification: Kathy Hudson MRN:  [id number] Principal Diagnosis: Major depressive disorder, recurrent, severe without psychotic behavior ([place]) Diagnosis:   Patient Active Problem List   Diagnosis Date Noted  . Major depressive disorder, recurrent, severe without psychotic behavior ([place]) [F33.2] 03/22/2016    Priority: High  . GAD (generalized anxiety disorder) [F41.1] 03/22/2016    Priority: High  . Trichomoniasis [A59.9] 03/24/2016  . Cocaine use [F14.90] 03/24/2016  . ETOH abuse [F10.10] 03/24/2016  . Marijuana dependence ([place]) [F12.20] 03/24/2016  . MDD (major depressive disorder), recurrent severe, without psychosis ([place]) [F33.2] 03/23/2016  . Psychoactive substance-induced mood disorder ([place]) [X03.83, F06.30] 03/23/2016  . BV (bacterial vaginosis) [N76.0, B96.89]   . Suicidal ideations [R45.851]   . ADHD (attention deficit hyperactivity disorder) [F90.9] 08/05/2014    Total Time spent with patient: 45 minutes  HPI:   Kathy Hudson is a 22 y.o. female patient admitted to the ED following expressions of suicidal ideation and homicidal ideation at home and under IVC from her mother. On admission patient expresses "I want to go to heaven." Mom was concerned that patient was going to open the door of the care and kill herself. Additionally, mom states that patient looked at mom and said "she was the devil and belonged to hell." Mom states that patient was not making sense with paranoid thoughts". Patient admits to being under the influence of methamphetamine when she was brought into the ED. Patient had relapsing remitting lucidity from admission until this early this morning when she finally fell asleep. Patient endorses using methamphetamines three to four times a week with last use being yesterday stating she used 1/2 -1 gram with each use. Patient is vague in reference  to other substances or length of use. On admission patient expresses to nurse staff member, "I feel like you are Jesus." Staff members asked her if she would like to take a shower and patient stated, "I want to cleanse my soul." Patient also noted yesterday that  "I am here because I screamed when they tried to draw my blood. I am not depressed."   This morning after patient was able to sleep 3 hours patient denies suicidal ideation, thoughts of hurting herself or others. Patient denies any prior auditory or visual hallucinations. Patient unsure why she is in the ED and is requesting to be released. Patient denies any other concerns.  Past Psychiatric History:  Substance Use Disorder  Risk to Self: Suicidal Ideation: No Suicidal Intent: No Is patient at risk for suicide?: Yes(Per IVC) Suicidal Plan?: No Access to Means: No What has been your use of drugs/alcohol within the last 12 months?: Current use How many times?: (UTA) Other Self Harm Risks: (Unknown) Triggers for Past Attempts: Unknown Intentional Self Injurious Behavior: (UTA) Risk to Others: Homicidal Ideation: (UTA) Thoughts of Harm to Others: (UTA) Current Homicidal Intent: (UTA) Current Homicidal Plan: (UTA) Access to Homicidal Means: (UTA) Identified Victim: (UTA) History of harm to others?: (UTA) Assessment of Violence: (UTA) Violent Behavior Description: (UTA) Does patient have access to weapons?: (UTA) Criminal Charges Pending?: (UTA) Does patient have a court date: (UTA) Prior Inpatient Therapy: Prior Inpatient Therapy: No Prior Outpatient Therapy: Prior Outpatient Therapy: Yes Prior Therapy Dates: 2016(Per notes) Prior Therapy Facilty/Provider(s): [facility]) Reason for Treatment: MH issues Does patient have an ACCT team?: No Does patient have Intensive In-House Services?  : No Does patient  have [facility] services? : (UTA) Does patient have P4CC services?: No  Past Medical  History:  Past Medical History:  Diagnosis Date  . ADHD (attention deficit hyperactivity disorder)    No past surgical history on file. Family History: No family history on file.  Social History:  Social History   Substance and Sexual Activity  Alcohol Use No  . Alcohol/week: 0.0 oz     Social History   Substance and Sexual Activity  Drug Use Yes  . Types: Benzodiazepines, Marijuana    Social History   Socioeconomic History  . Marital status: Single    Spouse name: Not on file  . Number of children: Not on file  . Years of education: Not on file  . Highest education level: Not on file  Social Needs  . Financial resource strain: Not on file  . Food insecurity - worry: Not on file  . Food insecurity - inability: Not on file  . Transportation needs - medical: Not on file  . Transportation needs - non-medical: Not on file  Occupational History  . Not on file  Tobacco Use  . Smoking status: Light Tobacco Smoker    Types: Cigarettes  . Smokeless tobacco: Never Used  Substance and Sexual Activity  . Alcohol use: No    Alcohol/week: 0.0 oz  . Drug use: Yes    Types: Benzodiazepines, Marijuana  . Sexual activity: Yes    Birth control/protection: None  Other Topics Concern  . Not on file  Social History Narrative  . Not on file   Allergies:  No Known Allergies  Labs:  Results for orders placed or performed during the hospital encounter of 07/16/17 (from the past 48 hour(s))  Comprehensive metabolic panel     Status: Abnormal   Collection Time: 07/16/17  3:07 PM  Result Value Ref Range   Sodium 140 135 - 145 mmol/L   Potassium 4.3 3.5 - 5.1 mmol/L   Chloride 105 101 - 111 mmol/L   CO2 23 22 - 32 mmol/L   Glucose, Bld 101 (H) 65 - 99 mg/dL   BUN 8 6 - 20 mg/dL   Creatinine, Ser 0.82 0.44 - 1.00 mg/dL   Calcium 10.0 8.9 - 10.3 mg/dL   Total Protein 8.5 (H) 6.5 - 8.1 g/dL   Albumin 5.1 (H) 3.5 - 5.0 g/dL   AST 22 15 - 41 U/L   ALT 13 (L) 14 - 54 U/L   Alkaline  Phosphatase 87 38 - 126 U/L   Total Bilirubin 1.2 0.3 - 1.2 mg/dL   GFR calc non Af Amer >60 >60 mL/min   GFR calc Af Amer >60 >60 mL/min    Comment: (NOTE) The eGFR has been calculated using the CKD EPI equation. This calculation has not been validated in all clinical situations. eGFR's persistently <60 mL/min signify possible Chronic Kidney Disease.    Anion gap 12 5 - 15    Comment: Performed at [facility], [place] [address], [place], [place] [address]  Ethanol     Status: None   Collection Time: 07/16/17  3:07 PM  Result Value Ref Range   Alcohol, Ethyl (B) <10 <10 mg/dL    Comment:        LOWEST DETECTABLE LIMIT FOR SERUM ALCOHOL IS 10 mg/dL FOR MEDICAL PURPOSES ONLY Performed at [facility], [place] [address] [place], [place] [address]   cbc     Status: Abnormal   Collection Time: 07/16/17  3:07 PM  Result Value Ref Range   WBC 18.4 (H) 4.0 - 10.5 K/uL   RBC 5.25 (H) 3.87 - 5.11 MIL/uL   Hemoglobin 15.6 (H) 12.0 - 15.0 g/dL   HCT 45.9 36.0 - 46.0 %   MCV 87.4 78.0 - 100.0 fL   MCH 29.7 26.0 - 34.0 pg   MCHC 34.0 30.0 - 36.0 g/dL   RDW 12.8 11.5 - 15.5 %   Platelets 444 (H) 150 - 400 K/uL    Comment: Performed at [facility], [place] [address], [place], [place] [address]  Rapid urine drug screen (hospital performed)     Status: Abnormal   Collection Time: 07/16/17  3:07 PM  Result Value Ref Range   Opiates NONE DETECTED NONE DETECTED   Cocaine NONE DETECTED NONE DETECTED   Benzodiazepines NONE DETECTED NONE DETECTED   Amphetamines POSITIVE (A) NONE DETECTED   Tetrahydrocannabinol POSITIVE (A) NONE DETECTED   Barbiturates NONE DETECTED NONE DETECTED    Comment: (NOTE) DRUG SCREEN FOR MEDICAL PURPOSES ONLY.  IF CONFIRMATION IS NEEDED FOR ANY PURPOSE, NOTIFY LAB WITHIN 5 DAYS. LOWEST DETECTABLE LIMITS FOR URINE DRUG SCREEN Drug Class                     Cutoff (ng/mL) Amphetamine and metabolites     1000 Barbiturate and metabolites    200 Benzodiazepine                 099 Tricyclics and metabolites     300 Opiates and metabolites        300 Cocaine and metabolites        300 THC                            50 Performed at [facility] [place] [address] [place], [place] [address]   POC urine preg, ED     Status: None   Collection Time: 07/16/17  3:18 PM  Result Value Ref Range   Preg Test, Ur NEGATIVE NEGATIVE    Comment:        THE SENSITIVITY OF THIS METHODOLOGY IS >24 mIU/mL   Urinalysis, Routine w reflex microscopic     Status: Abnormal   Collection Time: 07/16/17 11:27 PM  Result Value Ref Range   Color, Urine YELLOW YELLOW   APPearance CLOUDY (A) CLEAR   Specific Gravity, Urine 1.006 1.005 - 1.030   pH 9.0 (H) 5.0 - 8.0   Glucose, UA NEGATIVE NEGATIVE mg/dL   Hgb urine dipstick SMALL (A) NEGATIVE   Bilirubin Urine NEGATIVE NEGATIVE   Ketones, ur NEGATIVE NEGATIVE mg/dL   Protein, ur NEGATIVE NEGATIVE mg/dL   Nitrite NEGATIVE NEGATIVE   Leukocytes, UA NEGATIVE NEGATIVE   RBC / HPF 0-5 0 - 5 RBC/hpf   WBC, UA 0-5 0 - 5 WBC/hpf   Bacteria, UA MANY (A) NONE SEEN   Squamous Epithelial / LPF TOO NUMEROUS TO COUNT (A) NONE SEEN   Mucus PRESENT     Comment: Performed at [facility], [place] [address] [place], [place] [address]  CBC with Differential     Status: Abnormal   Collection Time: 07/17/17  1:56 AM  Result Value Ref Range   WBC 12.6 (H) 4.0 - 10.5 K/uL   RBC 4.62 3.87 - 5.11 MIL/uL   Hemoglobin 13.9 12.0 - 15.0 g/dL   HCT 41.2 36.0 - 46.0 %   MCV 89.2 78.0 -  100.0 fL   MCH 30.1 26.0 - 34.0 pg   MCHC 33.7 30.0 - 36.0 g/dL   RDW 13.1 11.5 - 15.5 %   Platelets 383 150 - 400 K/uL   Neutrophils Relative % 58 %   Neutro Abs 7.3 1.7 - 7.7 K/uL   Lymphocytes Relative 30 %   Lymphs Abs 3.7 0.7 - 4.0 K/uL   Monocytes Relative 10 %   Monocytes Absolute 1.2 (H) 0.1 - 1.0 K/uL   Eosinophils Relative 3 %   Eosinophils Absolute  0.3 0.0 - 0.7 K/uL   Basophils Relative 1 %   Basophils Absolute 0.1 0.0 - 0.1 K/uL    Comment: Performed at [facility], [place] [address] [place], [place] [address]    Current Facility-Administered Medications  Medication Dose Route Frequency Provider Last Rate Last Dose  . benztropine (COGENTIN) tablet 0.5 mg  0.5 mg Oral BID Kynzleigh Bandel, MD      . gabapentin (NEURONTIN) capsule 200 mg  200 mg Oral BID Aasha Dina, MD      . haloperidol (HALDOL) tablet 2 mg  2 mg Oral BID Patrecia Pour, NP   2 mg at 07/17/17 1008  . nicotine (NICODERM CQ - dosed in mg/24 hours) patch 21 mg  21 mg Transdermal Once Mc Hollen, MD   21 mg at 07/17/17 1012  . traZODone (DESYREL) tablet 50 mg  50 mg Oral QHS Lindon Romp A, NP   50 mg at 07/16/17 2143   Current Outpatient Medications  Medication Sig Dispense Refill  . busPIRone (BUSPAR) 10 MG tablet Take 1 tablet (10 mg total) by mouth 2 (two) times daily. 60 tablet 0  . ARIPiprazole (ABILIFY) 10 MG tablet Take 1 tablet (10 mg total) by mouth daily. (Patient not taking: Reported on 07/16/2017) 30 tablet 0  . hydrOXYzine (ATARAX/VISTARIL) 50 MG tablet Take 1 tablet (50 mg total) by mouth every 6 (six) hours as needed for anxiety. (Patient not taking: Reported on 07/16/2017) 30 tablet 0  . metroNIDAZOLE (FLAGYL) 500 MG tablet Take 1 tablet (500 mg total) by mouth every 12 (twelve) hours. (Patient not taking: Reported on 07/16/2017) 9 tablet 0  . nicotine (NICODERM CQ - DOSED IN MG/24 HOURS) 14 mg/24hr patch Place 1 patch (14 mg total) onto the skin daily. (Patient not taking: Reported on 07/16/2017) 28 patch 0  . traZODone (DESYREL) 50 MG tablet Take 1 tablet (50 mg total) by mouth at bedtime as needed for sleep. (Patient not taking: Reported on 07/16/2017) 30 tablet 0    Musculoskeletal: Strength & Muscle Tone: within normal limits Gait & Station: normal Patient leans: N/A  Psychiatric Specialty Exam: Physical Exam   Constitutional: She is oriented to person, place, and time. She appears well-developed and well-nourished.  HENT:  Head: Normocephalic.  Neurological: She is alert and oriented to person, place, and time. She has normal strength.  Skin: Skin is warm and dry.  Psychiatric: Her speech is normal and behavior is normal. Her mood appears anxious. Thought content is paranoid. Cognition and memory are impaired. She expresses impulsivity.    Review of Systems  Psychiatric/Behavioral: The patient is nervous/anxious.   All other systems reviewed and are negative.   Blood pressure (!) 124/94, pulse 72, temperature 98.1 F (36.7 C), temperature source Oral, resp. rate 16, height 5' 4"  (1.626 m), weight 49.9 kg (110 lb), last menstrual period 07/09/2017, SpO2 100 %.Body mass index is 18.88 kg/m.  General Appearance: Disheveled  Eye Contact:  Minimal  Speech:  Clear and Coherent  Volume:  Normal  Mood:  Euthymic  Affect:  Congruent  Thought Process:  Coherent  Orientation:  Full (Time, Place, and Person)  Thought Content:  Logical  Suicidal Thoughts:  No  Homicidal Thoughts:  No  Memory:  Immediate;   Good Recent;   Good Remote;   Good  Judgement:  Impaired  Insight:  Lacking  Psychomotor Activity:  Normal  Concentration:  Concentration: Fair and Attention Span: Fair  Recall:  [place] of Knowledge:  Good  Language:  Good  Akathisia:  No  Handed:  Right  AIMS (if indicated):     Assets:  Communication Skills Physical Health Resilience Social Support  ADL's:  Intact  Cognition:  Impaired,  Mild  Sleep:        Treatment Plan Summary: Daily contact with patient to assess and evaluate symptoms and progress in treatment, Medication management and Plan major depressive disorder, recurrent, severe with psychosis:  -Crisis stabilization -Medication management:  Started Haldol 2 mg BId for psychosis, Cogentin 0.5 mg daily for EPS, Trazodone 50 mg at bedtime for sleep, and gabapentin  200 mg BID for mood stabilization -Individual counseling  Disposition: Recommend psychiatric Inpatient admission when medically cleared.  Waylan Boga, NP 07/17/2017 12:45 PM  Patient seen face-to-face for psychiatric evaluation, chart reviewed and case discussed with the physician extender and developed treatment plan. Reviewed the information documented and agree with the treatment plan. Corena Pilgrim, MD

## 2017-07-18 DIAGNOSIS — G4700 Insomnia, unspecified: Secondary | ICD-10-CM

## 2017-07-18 DIAGNOSIS — F15251 Other stimulant dependence with stimulant-induced psychotic disorder with hallucinations: Secondary | ICD-10-CM

## 2017-07-18 DIAGNOSIS — F1290 Cannabis use, unspecified, uncomplicated: Secondary | ICD-10-CM

## 2017-07-18 DIAGNOSIS — R450 Nervousness: Secondary | ICD-10-CM

## 2017-07-18 DIAGNOSIS — F419 Anxiety disorder, unspecified: Secondary | ICD-10-CM

## 2017-07-18 MED ORDER — HYDROXYZINE HCL 25 MG PO TABS
25.0000 mg | ORAL_TABLET | Freq: Four times a day (QID) | ORAL | Status: DC | PRN
  Administered 2017-07-19 (×2): 25 mg via ORAL
  Filled 2017-07-18 (×3): qty 1

## 2017-07-18 MED ORDER — QUETIAPINE FUMARATE 25 MG PO TABS
25.0000 mg | ORAL_TABLET | Freq: Two times a day (BID) | ORAL | Status: DC
  Administered 2017-07-18 – 2017-07-20 (×4): 25 mg via ORAL
  Filled 2017-07-18: qty 1
  Filled 2017-07-18: qty 14
  Filled 2017-07-18 (×5): qty 1
  Filled 2017-07-18: qty 14
  Filled 2017-07-18: qty 1

## 2017-07-18 MED ORDER — LORAZEPAM 0.5 MG PO TABS
0.5000 mg | ORAL_TABLET | Freq: Four times a day (QID) | ORAL | Status: DC | PRN
  Administered 2017-07-18 – 2017-07-19 (×5): 0.5 mg via ORAL
  Filled 2017-07-18 (×5): qty 1

## 2017-07-18 MED ORDER — ENSURE ENLIVE PO LIQD
237.0000 mL | Freq: Two times a day (BID) | ORAL | Status: DC
  Administered 2017-07-19 – 2017-07-20 (×3): 237 mL via ORAL

## 2017-07-18 MED ORDER — TRAZODONE HCL 50 MG PO TABS
50.0000 mg | ORAL_TABLET | Freq: Every evening | ORAL | Status: DC | PRN
  Administered 2017-07-18 – 2017-07-19 (×2): 50 mg via ORAL
  Filled 2017-07-18: qty 1
  Filled 2017-07-18: qty 7
  Filled 2017-07-18: qty 1

## 2017-07-18 NOTE — [facility] Group Notes (Signed)
Adult Psychoeducational Group Note  Date:  07/18/2017 Time:  11:16 AM  Group Topic/Focus:  Goals Group:   The focus of this group is to help patients establish daily goals to achieve during treatment and discuss how the patient can incorporate goal setting into their daily lives to aide in recovery.  Participation Level:  Active  Participation Quality:  Appropriate  Affect:  Appropriate  Cognitive:  Appropriate  Insight: Appropriate  Engagement in Group:  Engaged  Modes of Intervention:  Discussion, Education, Exploration, Problem-solving, Socialization and Support  Additional Comments:  Pt participated during Orientation/Goals group this morning. Pt stated that her goal for the day is, "To go the whole day without accusing someone of something."  Tania Adedams, Johana Hopkinson C 07/18/2017, 11:16 AM
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
## 2017-07-18 NOTE — Progress Notes (Signed)
Pt did not attend group. She denies SI, HI, and AVH. Responded to questions with head nods. Pt did not talk. Pt exhibits sullen/depressive behaviors. Pt refused sleeping medications. Will continue to monitor.
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
## 2017-07-18 NOTE — [facility] Group Notes (Signed)
[facility] LCSW Group Therapy Note  Date/Time:  07/18/2017  9:00AM-10:00AM  Type of Therapy and Topic:  Group Therapy:  Music and Mood  Participation Level:  Minimal   Description of Group: In this process group, members listened to a variety of genres of music and identified that different types of music evoke different responses.  Patients were encouraged to identify music that was soothing for them and music that was energizing for them.  Patients discussed how this knowledge can help with wellness and recovery in various ways including managing depression and anxiety as well as encouraging healthy sleep habits.    Therapeutic Goals: 1. Patients will explore the impact of different varieties of music on mood 2. Patients will verbalize the thoughts they have when listening to different types of music 3. Patients will identify music that is soothing to them as well as music that is energizing to them 4. Patients will discuss how to use this knowledge to assist in maintaining wellness and recovery 5. Patients will explore the use of music as a coping skill  Summary of Patient Progress:  At the beginning of group, patient expressed that she felt displaced, and at the end of group said she felt "content."  She was in and out of the room a little, but engaged when present.  Therapeutic Modalities: Solution Focused Brief Therapy Activity   Ambrose MantleMareida Grossman-Orr, LCSW

## 2017-07-18 NOTE — [facility] Group Notes (Signed)
[facility] Group Notes  Date:  07/18/2017  Time:  2:54 PM  Type of Therapy:  Psychoeducational Skills  Participation Level:  Did Not Attend  Participation Quality:  Did not attend   Affect:  Did not attend   Cognitive:  Did not attend   Insight:  None  Engagement in Group:  None  Modes of Intervention:  Activity, Discussion and Education  Summary of Progress/Problems: Patient was invited to group but did not attend. 

## 2017-07-18 NOTE — Progress Notes (Signed)
Patient ID: Kathy Hudson, female   DOB: 10-23-1995, 22 y.o.   MRN: [id number]  DAR: Pt. Denies SI/HI and A/V Hallucinations. She reports feeling anxious and "shaky" in her legs this morning. She is initially curt and isolative to her room but is seen minimally in the milieu as the day progresses. Patient is somewhat guarded, suspicious. Her mother called this afternoon and states that patient called her mother "Prudy FeelerSatan." Patient's mother states, "I've never seen her like that before." Patient's mother reports drug use in the past and mom's wishes are that patient go to a treatment facility after discharge. Support and encouragement provided to the patient and patient's mother. Q15 minute checks are maintained for safety.

## 2017-07-18 NOTE — Progress Notes (Signed)
D.  Pt pleasant on approach, remained in bed during group.  Pt was observed engaged in appropriate interaction after group with peers on the unit.  PT denies SI/HI/AVH at this time.  A.  Support and encouragement offered, medications given as ordered  R.  Pt remains safe on the unit, will continue to monitor.

## 2017-07-19 DIAGNOSIS — Z79899 Other long term (current) drug therapy: Secondary | ICD-10-CM

## 2017-07-19 DIAGNOSIS — F332 Major depressive disorder, recurrent severe without psychotic features: Principal | ICD-10-CM

## 2017-07-19 DIAGNOSIS — F17210 Nicotine dependence, cigarettes, uncomplicated: Secondary | ICD-10-CM

## 2017-07-19 DIAGNOSIS — F1594 Other stimulant use, unspecified with stimulant-induced mood disorder: Secondary | ICD-10-CM

## 2017-07-19 LAB — BASIC METABOLIC PANEL
ANION GAP: 10 (ref 5–15)
BUN: 10 mg/dL (ref 6–20)
CHLORIDE: 104 mmol/L (ref 101–111)
CO2: 28 mmol/L (ref 22–32)
CREATININE: 0.71 mg/dL (ref 0.44–1.00)
Calcium: 9.7 mg/dL (ref 8.9–10.3)
GFR calc non Af Amer: >60 mL/min (ref >60)
Glucose, Bld: 71 mg/dL (ref 65–99)
Potassium: 4.2 mmol/L (ref 3.5–5.1)
SODIUM: 142 mmol/L (ref 135–145)

## 2017-07-19 LAB — TSH: TSH: 0.543 u[IU]/mL (ref 0.350–4.500)

## 2017-07-19 MED ORDER — PROPRANOLOL HCL 10 MG PO TABS
10.0000 mg | ORAL_TABLET | Freq: Two times a day (BID) | ORAL | Status: DC
  Administered 2017-07-19 – 2017-07-20 (×2): 10 mg via ORAL
  Filled 2017-07-19 (×2): qty 1
  Filled 2017-07-19: qty 14
  Filled 2017-07-19 (×3): qty 1
  Filled 2017-07-19: qty 14

## 2017-07-19 NOTE — Tx Team (Signed)
Interdisciplinary Treatment and Diagnostic Plan Update  07/19/2017 Time of Session: 0830AM Kathy Hudson MRN: [id number]  Principal Diagnosis: MDD, recurrent, severe, without psychotic features  Secondary Diagnoses: Active Problems:   Major depressive disorder, recurrent severe without psychotic features (HCC)   Current Medications:  Current Facility-Administered Medications  Medication Dose Route Frequency Provider Last Rate Last Dose  . acetaminophen (TYLENOL) tablet 650 mg  650 mg Oral Q6H PRN Charm Rings, NP      . alum & mag hydroxide-simeth (MAALOX/MYLANTA) 200-200-20 MG/5ML suspension 30 mL  30 mL Oral Q4H PRN Charm Rings, NP      . feeding supplement (ENSURE ENLIVE) (ENSURE ENLIVE) liquid 237 mL  237 mL Oral BID BM Cobos, Rockey Situ, MD      . hydrOXYzine (ATARAX/VISTARIL) tablet 25 mg  25 mg Oral Q6H PRN Cobos, Rockey Situ, MD      . LORazepam (ATIVAN) tablet 0.5 mg  0.5 mg Oral Q6H PRN Cobos, Rockey Situ, MD   0.5 mg at 07/19/17 0802  . magnesium hydroxide (MILK OF MAGNESIA) suspension 30 mL  30 mL Oral Daily PRN Charm Rings, NP      . nicotine (NICODERM CQ - dosed in mg/24 hours) patch 21 mg  21 mg Transdermal Daily Cobos, Rockey Situ, MD   21 mg at 07/19/17 0803  . QUEtiapine (SEROQUEL) tablet 25 mg  25 mg Oral BID Cobos, Rockey Situ, MD   25 mg at 07/19/17 0802  . traZODone (DESYREL) tablet 50 mg  50 mg Oral QHS PRN Cobos, Rockey Situ, MD   50 mg at 07/18/17 2058   PTA Medications: Medications Prior to Admission  Medication Sig Dispense Refill Last Dose  . ARIPiprazole (ABILIFY) 10 MG tablet Take 1 tablet (10 mg total) by mouth daily. (Patient not taking: Reported on 07/16/2017) 30 tablet 0 Not Taking at Unknown time  . busPIRone (BUSPAR) 10 MG tablet Take 1 tablet (10 mg total) by mouth 2 (two) times daily. 60 tablet 0 Past Week at Unknown time  . hydrOXYzine (ATARAX/VISTARIL) 50 MG tablet Take 1 tablet (50 mg total) by mouth every 6 (six) hours as needed for anxiety.  (Patient not taking: Reported on 07/16/2017) 30 tablet 0 Not Taking at Unknown time  . metroNIDAZOLE (FLAGYL) 500 MG tablet Take 1 tablet (500 mg total) by mouth every 12 (twelve) hours. (Patient not taking: Reported on 07/16/2017) 9 tablet 0 Not Taking at Unknown time  . nicotine (NICODERM CQ - DOSED IN MG/24 HOURS) 14 mg/24hr patch Place 1 patch (14 mg total) onto the skin daily. (Patient not taking: Reported on 07/16/2017) 28 patch 0 Not Taking at Unknown time  . traZODone (DESYREL) 50 MG tablet Take 1 tablet (50 mg total) by mouth at bedtime as needed for sleep. (Patient not taking: Reported on 07/16/2017) 30 tablet 0 Not Taking at Unknown time    Patient Stressors: Financial difficulties Substance abuse  Patient Strengths: Wellsite geologist fund of knowledge Physical Health  Treatment Modalities: Medication Management, Group therapy, Case management,  1 to 1 session with clinician, Psychoeducation, Recreational therapy.   Physician Treatment Plan for Primary Diagnosis: MDD, recurrent, severe, without psychotic features Long Term Goal(s): Improvement in symptoms so as ready for discharge Improvement in symptoms so as ready for discharge   Short Term Goals: Ability to identify triggers associated with substance abuse/mental health issues will improve Ability to identify changes in lifestyle to reduce recurrence of condition will improve Ability to identify and develop effective coping  behaviors will improve Ability to maintain clinical measurements within normal limits will improve  Medication Management: Evaluate patient's response, side effects, and tolerance of medication regimen.  Therapeutic Interventions: 1 to 1 sessions, Unit Group sessions and Medication administration.  Evaluation of Outcomes: Progressing  Physician Treatment Plan for Secondary Diagnosis: Active Problems:   Major depressive disorder, recurrent severe without psychotic features (HCC)  Long Term  Goal(s): Improvement in symptoms so as ready for discharge Improvement in symptoms so as ready for discharge   Short Term Goals: Ability to identify triggers associated with substance abuse/mental health issues will improve Ability to identify changes in lifestyle to reduce recurrence of condition will improve Ability to identify and develop effective coping behaviors will improve Ability to maintain clinical measurements within normal limits will improve     Medication Management: Evaluate patient's response, side effects, and tolerance of medication regimen.  Therapeutic Interventions: 1 to 1 sessions, Unit Group sessions and Medication administration.  Evaluation of Outcomes: Progressing   RN Treatment Plan for Primary Diagnosis: MDD, recurrent, severe, without psychotic features Long Term Goal(s): Knowledge of disease and therapeutic regimen to maintain health will improve  Short Term Goals: Ability to remain free from injury will improve, Ability to verbalize feelings will improve and Ability to identify and develop effective coping behaviors will improve  Medication Management: RN will administer medications as ordered by provider, will assess and evaluate patient's response and provide education to patient for prescribed medication. RN will report any adverse and/or side effects to prescribing provider.  Therapeutic Interventions: 1 on 1 counseling sessions, Psychoeducation, Medication administration, Evaluate responses to treatment, Monitor vital signs and CBGs as ordered, Perform/monitor CIWA, COWS, AIMS and Fall Risk screenings as ordered, Perform wound care treatments as ordered.  Evaluation of Outcomes: Progressing   LCSW Treatment Plan for Primary Diagnosis: MDD, recurrent, severe, without psychotic features Long Term Goal(s): Safe transition to appropriate next level of care at discharge, Engage patient in therapeutic group addressing interpersonal concerns.  Short Term  Goals: Engage patient in aftercare planning with referrals and resources, Facilitate patient progression through stages of change regarding substance use diagnoses and concerns and Identify triggers associated with mental health/substance abuse issues  Therapeutic Interventions: Assess for all discharge needs, 1 to 1 time with Social worker, Explore available resources and support systems, Assess for adequacy in community support network, Educate family and significant other(s) on suicide prevention, Complete Psychosocial Assessment, Interpersonal group therapy.  Evaluation of Outcomes: Progressing   Progress in Treatment: Attending groups: Yes. Participating in groups: Yes. Taking medication as prescribed: Yes. Toleration medication: Yes. Family/Significant other contact made: No, will contact:  pt's mother Patient understands diagnosis: Yes. Discussing patient identified problems/goals with staff: Yes. Medical problems stabilized or resolved: Yes. Denies suicidal/homicidal ideation: Yes. Issues/concerns per patient self-inventory: No. Other: n/a   New problem(s) identified: No, Describe:  n/a  New Short Term/Long Term Goal(s): detox, medication management for mood stabilization; elimination of SI thoughts; development of comprehensive mental wellness/sobriety plan.   Patient Goal: "To get off meth and get myself to a place mentally where I can get back to working and live with my mother."   Discharge Plan or Barriers: CSW assessing-Pt interested in medication management appt. She has therapist Dorcas Carrow on [address]. [facility] pamphlet and mobile crisis information provided to pt. AA/NA list also provided.   Reason for Continuation of Hospitalization: Anxiety Depression Medication stabilization Withdrawal symptoms  Estimated Length of Stay: Tuesday, 07/20/17  Attendees: Patient: Kathy Hudson 07/19/2017 10:40 AM  Physician: Dr. Altamese Carolinaainville MD; Dr. Jama Flavorsobos MD 07/19/2017 10:40 AM   Nursing: Armando ReichertMarian RN; Christa RN 07/19/2017 10:40 AM  RN Care Manager:x 07/19/2017 10:40 AM  Social Worker: Chartered loss adjusterHeather Smart, LCSW 07/19/2017 10:40 AM  Recreational Therapist: x 07/19/2017 10:40 AM  Other: Reola Calkinsravis Money NP 07/19/2017 10:40 AM  Other:  07/19/2017 10:40 AM  Other: 07/19/2017 10:40 AM    Scribe for Treatment Team: Ledell PeoplesHeather N Smart, LCSW 07/19/2017 10:40 AM

## 2017-07-19 NOTE — [facility] Group Notes (Signed)
LCSW Group Therapy Note   07/19/2017 1:15pm   Type of Therapy and Topic:  Group Therapy:  Overcoming Obstacles   Participation Level:  Active   Description of Group:    In this group patients will be encouraged to explore what they see as obstacles to their own wellness and recovery. They will be guided to discuss their thoughts, feelings, and behaviors related to these obstacles. The group will process together ways to cope with barriers, with attention given to specific choices patients can make. Each patient will be challenged to identify changes they are motivated to make in order to overcome their obstacles. This group will be process-oriented, with patients participating in exploration of their own experiences as well as giving and receiving support and challenge from other group members.   Therapeutic Goals: 1. Patient will identify personal and current obstacles as they relate to admission. 2. Patient will identify barriers that currently interfere with their wellness or overcoming obstacles.  3. Patient will identify feelings, thought process and behaviors related to these barriers. 4. Patient will identify two changes they are willing to make to overcome these obstacles:      Summary of Patient Progress   Kathy Hudson was attentive and engaged during today's processing group. She shared that her biggest obstacle involves gaining her family's trust back. "I plan to live with my mom when I leave the hospital. I know I will be treated like a kid again. But I deserve it." Kathy Hudson continues to show progress in the group setting with improving insight.    Therapeutic Modalities:   Cognitive Behavioral Therapy Solution Focused Therapy Motivational Interviewing Relapse Prevention Therapy  Ledell PeoplesHeather N Smart, LCSW 07/19/2017 1:04 PM

## 2017-07-19 NOTE — Progress Notes (Signed)
Patient ID: Kathy Hudson, female   DOB: Aug 29, 1995, 22 y.o.   MRN: [id number]  DAR: Pt. Denies SI/HI and A/V Hallucinations however this afternoon appears suspicious when received her scheduled Propranolol. She did take it but reports, "I feel like I've seen this before." She reports her sleep last night was good, her appetite is good, her energy level is normal, and her concentration is good. She rates her depression level is 3/10, her hopelessness level is 0/10, and anxiety level is 5/10. Patient does not report any pain. Support and encouragement provided to the patient. Scheduled and PRN medications administered to patient per physician's orders. Patients pulse level was elevated, NP Nwoko was notified. Patient seen in the milieu interacting with her peers. Q15 minute checks are maintained for safety.

## 2017-07-19 NOTE — [facility] Suicide Risk Assessment (Signed)
[facility] INPATIENT:  Family/Significant Other Suicide Prevention Education  Suicide Prevention Education:  Education Completed; Gifford ShaveCherie Engelson (mother, [phone]) has been identified by the patient as the family member/significant other with whom the patient will be residing, and identified as the person(s) who will aid the patient in the event of a mental health crisis (suicidal ideations/suicide attempt).  With written consent from the patient, the family member/significant other has been provided the following suicide prevention education, prior to the and/or following the discharge of the patient.  The suicide prevention education provided includes the following:  Suicide risk factors  Suicide prevention and interventions  National Suicide Hotline telephone number  [facility] assessment telephone number  [facility] Assistance 911  [facility] and/or Residential Mobile Crisis Unit telephone number  Request made of family/significant other to:  Remove weapons (e.g., guns, rifles, knives), all items previously/currently identified as safety concern.    Remove drugs/medications (over-the-counter, prescriptions, illicit drugs), all items previously/currently identified as a safety concern.  The family member/significant other verbalizes understanding of the suicide prevention education information provided.  The family member/significant other agrees to remove the items of safety concern listed above.  Maeola SarahJolan E Aniah Pauli 07/19/2017, 12:44 PM

## 2017-07-19 NOTE — Progress Notes (Signed)
Adult Psychoeducational Group Note  Date:  07/19/2017 Time:  9:08 PM  Group Topic/Focus:  Wrap-Up Group:   The focus of this group is to help patients review their daily goal of treatment and discuss progress on daily workbooks.  Participation Level:  Active  Participation Quality:  Appropriate  Affect:  Appropriate  Cognitive:  Appropriate  Insight: Appropriate  Engagement in Group:  Engaged  Modes of Intervention:  Discussion  Additional Comments: The patient expressed that he rates today a 8.The patient also said that she is feeling better and is being more social.  Octavio Mannshigpen, Shadman Tozzi Lee 07/19/2017, 9:08 PM

## 2017-07-19 NOTE — Progress Notes (Signed)
Recreation Therapy Notes   Date:3.11.19 Time:9:30 a.m. Location: [address] Dayroom  Group Topic: Stress Management  Goal Area(s) Addresses:  Goal 1.1: To reduce stress  -Patient will report feeling a reduction in stress level  -Patient will identify the importance of stress management  -Patient will participate during stress management group treatment    Behavioral Response:Patient did not attend   Sheryle Hailarian Nikolette Reindl, Recreation Therapy Intern   Sheryle HailDarian Jacquees Gongora 07/19/2017 10:56 AM

## 2017-07-19 NOTE — Plan of Care (Signed)
  Progressing Safety: Periods of time without injury will increase 07/19/2017 2241 - Progressing by Arrie Aranhurch, Zakkiyya Barno J, RN Note Pt has not harmed self or others tonight.  She denies SI/HI and verbally contracts for safety.

## 2017-07-19 NOTE — Progress Notes (Signed)
D: Pt was in the dayroom upon initial approach.  Pt presents with anxious affect and mood.  She reports her day has "been really good, emotionally it was rocky to begin with especially when I saw my mom."  Her goal is to "keep on coloring, making small conversation" and "to go home tomorrow."  She reports she feels safe to discharge tomorrow.  Pt denies SI/HI, denies hallucinations, denies pain.  Pt has been visible in milieu interacting with peers and staff appropriately.  Pt attended evening group.    A: Introduced self to pt.  Actively listened to pt and offered support and encouragement.   PRN medication administered for anxiety and sleep.  Positive coping skills reinforced.  Q15 minute safety checks maintained.  R: Pt is safe on the unit.  Pt is compliant with medications.  Pt verbally contracts for safety.  Will continue to monitor and assess.

## 2017-07-19 NOTE — Progress Notes (Signed)
[facility] MD Progress Note  07/19/2017 4:06 PM Kathy Hudson  MRN:  [id number]  Subjective: Kathy Hudson reports, "I'm doing great. I feel good now. Can I go home now, I mean today? The reason I was brought to the hospital was because, I was using a lot of Meth. Then, I was up for 3 days, did not sleep. Then, I became psychotic. But, I'm actually feeling wonderful again. I'm ready to go home now to my family. I'm not interested in going to a rehabilitation treatment program. I know I can stop using on my own. I use drugs because they are just laying around. I'm not feeling depressed. I'm not anxious or feeling suicidal. I'm not psychotic any more. I'm sleeping good".   Patient is a 22 year old single female. Brought to ED by mother. At this time patient is a fair historian, presents dysphoric and quite irritable. Reportedly patient had been living in [place] with her BF, but mother had gone up to pick her up due to report from family member that patient had been acting erratically and incoherently. During the drive back patient presented acutely agitated, screaming, and reporting visual hallucinations of seeing the devil. At this time patient states " I had not been sleeping for days, I was tweaking".   Principal Problem: Psychoactive substance-induced mood disorder (HCC)  Diagnosis:   Patient Active Problem List   Diagnosis Date Noted  . Psychoactive substance-induced mood disorder (HCC) [M57.84, F06.30] 03/23/2016    Priority: High  . Major depressive disorder, recurrent severe without psychotic features (HCC) [F33.2] 07/17/2017  . Trichomoniasis [A59.9] 03/24/2016  . Cocaine use [F14.90] 03/24/2016  . ETOH abuse [F10.10] 03/24/2016  . 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 MDD (major depressive disorder), recurrent severe, without psychosis (HCC) [F33.2] 03/23/2016  . Major depressive disorder, recurrent, severe without psychotic behavior (HCC) [F33.2] 03/22/2016  . GAD (generalized anxiety  disorder) [F41.1] 03/22/2016  . BV (bacterial vaginosis) [N76.0, B96.89]   . Suicidal ideations [R45.851]   . ADHD (attention deficit hyperactivity disorder) [F90.9] 08/05/2014   Total Time spent with patient: 25 minutes  Past Psychiatric History: See H&P  Past Medical History:  Past Medical History:  Diagnosis Date  . ADHD (attention deficit hyperactivity disorder)    History reviewed. No pertinent surgical history.  Family History: History reviewed. No pertinent family history.  Family Psychiatric  History: See H&P.  Social History:  Social History   Substance and Sexual Activity  Alcohol Use No  . Alcohol/week: 0.0 oz     Social History   Substance and Sexual Activity  Drug Use Yes  . Types: Benzodiazepines, Marijuana    Social History   Socioeconomic History  . Marital status: Single    Spouse name: None  . Number of children: None  . Years of education: None  . Highest education level: None  Social Needs  . Financial resource strain: None  . Food insecurity - worry: None  . Food insecurity - inability: None  . Transportation needs - medical: None  . Transportation needs - non-medical: None  Occupational History  . None  Tobacco Use  . Smoking status: Light Tobacco Smoker    Types: Cigarettes  . Smokeless tobacco: Never Used  Substance and Sexual Activity  . Alcohol use: No    Alcohol/week: 0.0 oz  . Drug use: Yes    Types: Benzodiazepines, Marijuana  . Sexual activity: Yes    Birth control/protection: None  Other Topics Concern  . None  Social  History Narrative  . None   Additional Social History:   Sleep: Good  Appetite:  Good  Current Medications: Current Facility-Administered Medications  Medication Dose Route Frequency Provider Last Rate Last Dose  . acetaminophen (TYLENOL) tablet 650 mg  650 mg Oral Q6H PRN Charm RingsLord, Jamison Y, NP      . alum & mag hydroxide-simeth (MAALOX/MYLANTA) 200-200-20 MG/5ML suspension 30 mL  30 mL Oral Q4H PRN  Charm RingsLord, Jamison Y, NP      . feeding supplement (ENSURE ENLIVE) (ENSURE ENLIVE) liquid 237 mL  237 mL Oral BID BM Cobos, Rockey SituFernando A, MD   237 mL at 07/19/17 1426  . hydrOXYzine (ATARAX/VISTARIL) tablet 25 mg  25 mg Oral Q6H PRN Cobos, Rockey SituFernando A, MD   25 mg at 07/19/17 1148  . LORazepam (ATIVAN) tablet 0.5 mg  0.5 mg Oral Q6H PRN Cobos, Rockey SituFernando A, MD   0.5 mg at 07/19/17 1426  . magnesium hydroxide (MILK OF MAGNESIA) suspension 30 mL  30 mL Oral Daily PRN Charm RingsLord, Jamison Y, NP      . nicotine (NICODERM CQ - dosed in mg/24 hours) patch 21 mg  21 mg Transdermal Daily Cobos, Rockey SituFernando A, MD   21 mg at 07/19/17 0803  . propranolol (INDERAL) tablet 10 mg  10 mg Oral BID Armandina StammerNwoko, Agnes I, NP   10 mg at 07/19/17 1545  . QUEtiapine (SEROQUEL) tablet 25 mg  25 mg Oral BID Cobos, Rockey SituFernando A, MD   25 mg at 07/19/17 0802  . traZODone (DESYREL) tablet 50 mg  50 mg Oral QHS PRN Cobos, Rockey SituFernando A, MD   50 mg at 07/18/17 2058   Lab Results: No results found for this or any previous visit (from the past 48 hour(s)).  Blood Alcohol level:  Lab Results  Component Value Date   ETH <10 07/16/2017   ETH <5 03/21/2016   Metabolic Disorder Labs: No results found for: HGBA1C, MPG No results found for: PROLACTIN No results found for: CHOL, TRIG, HDL, CHOLHDL, VLDL, LDLCALC  Physical Findings: AIMS: Facial and Oral Movements Muscles of Facial Expression: None, normal Lips and Perioral Area: None, normal Jaw: None, normal Tongue: None, normal,Extremity Movements Upper (arms, wrists, hands, fingers): None, normal Lower (legs, knees, ankles, toes): None, normal, Trunk Movements Neck, shoulders, hips: None, normal, Overall Severity Severity of abnormal movements (highest score from questions above): None, normal Incapacitation due to abnormal movements: None, normal Patient's awareness of abnormal movements (rate only patient's report): No Awareness, Dental Status Current problems with teeth and/or dentures?: No Does  patient usually wear dentures?: No  CIWA:    COWS:     Musculoskeletal: Strength & Muscle Tone: within normal limits Gait & Station: normal Patient leans: N/A  Psychiatric Specialty Exam: Physical Exam  Nursing note and vitals reviewed.   Review of Systems  Psychiatric/Behavioral: Positive for hallucinations (Hx. AH (Pychosis).) and substance abuse (Hx. drug use). Negative for depression, memory loss and suicidal ideas. The patient has insomnia ("Improving"). The patient is not nervous/anxious.     Blood pressure 119/76, pulse (!) 116, temperature 98.4 F (36.9 C), temperature source Oral, resp. rate 16, height 5\' 4"  (1.626 m), weight 45.8 kg (101 lb), last menstrual period 07/09/2017, SpO2 100 %.Body mass index is 17.34 kg/m.  General Appearance: Fairly Groomed  Eye Contact:  Fair  Speech:  variable, at times loud   Volume:  variable   Mood:  Dysphoric and Irritable  Affect:  Labile  Thought Process:  Linear and Descriptions of Associations:  Intact  Orientation:  Full (Time, Place, and Person)  Thought Content:  denies current hallucinations, no delusions expressed at this time, reports recent  hallucinations prior to admission  Suicidal Thoughts:  No denies suicidal or self injurious ideations, denies homicidal or violent ideations, contracts for safety on unit   Homicidal Thoughts:  No  Memory:  recent and remote grossly intact   Judgement:  Fair  Insight:  limited   Psychomotor Activity:  Normal mld distal tremors, no diaphoresis, no psychomotor agitation  Concentration:  Concentration: Fair and Attention Span: Fair  Recall:  Good  Fund of Knowledge:  Good  Language:  Good  Akathisia:  Negative  Handed:  Right  AIMS (if indicated):     Assets:  Communication Skills Desire for Improvement Resilience  ADL's:  Fair   Cognition:  WNL  Sleep:  Number of Hours: 5.75     Treatment Plan Summary: Daily contact with patient to assess and evaluate symptoms and progress in  treatment: Patient reports that she is approaching her baseline level of function. He denies withdrawal symptoms. Denies any SIHI, AVH, delusional thoughts or paranoia.  Will continue today 07/19/2017 plan as below except where it is noted.  Mood control/agitation.    - Continue Seroquel 25 mg po bid.  Anxiety.    - Continue Hydroxyzine 25 mg prn po Q 6 hrs.    - Continue Lorazepam 0.5 mg po prn Q 6 hours.     - Initiated Propranolol 10 mg po bid for elevated heart rate/anaxiety.  Insomnia.    - Continue Trazodone 50 mg po prn Q hs.  Nicotine withdrawal symptoms.    - Continue Nicotine patch 21 mg topically Q 24 hours.  Patient to participate in the group sessions. Discharge disposition is ongoing.     Armandina Stammer, NP, PMHNP, FNP-BC. 07/19/2017, 4:06 PM

## 2017-07-20 DIAGNOSIS — R443 Hallucinations, unspecified: Secondary | ICD-10-CM

## 2017-07-20 DIAGNOSIS — F0630 Mood disorder due to known physiological condition, unspecified: Secondary | ICD-10-CM

## 2017-07-20 MED ORDER — HYDROXYZINE HCL 25 MG PO TABS
25.0000 mg | ORAL_TABLET | Freq: Four times a day (QID) | ORAL | Status: DC | PRN
  Filled 2017-07-20: qty 10

## 2017-07-20 MED ORDER — PROPRANOLOL HCL 10 MG PO TABS: 10.0000 mg | ORAL_TABLET | Freq: Two times a day (BID) | ORAL | 0 refills | Status: AC

## 2017-07-20 MED ORDER — LORAZEPAM 0.5 MG PO TABS
0.5000 mg | ORAL_TABLET | Freq: Four times a day (QID) | ORAL | Status: DC | PRN
  Administered 2017-07-20: 0.5 mg via ORAL
  Filled 2017-07-20: qty 1

## 2017-07-20 MED ORDER — QUETIAPINE FUMARATE 25 MG PO TABS: 25.0000 mg | ORAL_TABLET | Freq: Two times a day (BID) | ORAL | 0 refills | Status: AC

## 2017-07-20 MED ORDER — HYDROXYZINE HCL 25 MG PO TABS: 25.0000 mg | ORAL_TABLET | Freq: Four times a day (QID) | ORAL | 0 refills | Status: AC | PRN

## 2017-07-20 MED ORDER — NICOTINE 21 MG/24HR TD PT24: 21.0000 mg | MEDICATED_PATCH | Freq: Every day | TRANSDERMAL | 0 refills | Status: AC

## 2017-07-20 MED ORDER — TRAZODONE HCL 50 MG PO TABS: 50.0000 mg | ORAL_TABLET | Freq: Every evening | ORAL | 0 refills | Status: AC | PRN

## 2017-07-20 NOTE — Progress Notes (Signed)
  [facility] Adult Case Management Discharge Plan :  Will you be returning to the same living situation after discharge:  No. Patient is returning home with her mother, Lenard SimmerCherie Gaskell [phone]) At discharge, do you have transportation home?: Yes,  patient's mother will pick her up around 1:00pm Do you have the ability to pay for your medications: Yes,  [employer]  Release of information consent forms completed and in the chart;  Patient's signature needed at discharge.  Patient to Follow up at: Follow-up Information    Terrill Mohraylor Krumroy,LCSW Follow up.   Why:  Please follow up with your therapsit within 3-5 days of discharge from the hosiptal.  Contact information: [address] [place], [place] [address] [phone]       [facility] Follow up on 07/22/2017.   Why:  Appointment is 07/22/17 at 11:00am.  Contact information: [address] [place] [address] [phone]           Next level of care provider [facility] Link:yes  Safety Planning and Suicide Prevention discussed: Yes,  with the patient's mother, Lenard SimmerCherie Gaskell  Have you used any form of tobacco in the last 30 days? (Cigarettes, Smokeless Tobacco, Cigars, and/or Pipes): Yes  Has patient been referred to the Quitline?: Patient refused referral  Patient has been referred for addiction treatment: Pt. refused referral  Maeola SarahJolan E Myquan Schaumburg, LCSWA 07/20/2017, 3:04 PM

## 2017-07-20 NOTE — [facility] Suicide Risk Assessment (Signed)
[facility] Discharge Suicide Risk Assessment   Principal Problem: Psychoactive substance-induced mood disorder [facility]) Discharge Diagnoses:  Patient Active Problem List   Diagnosis Date Noted  . Major depressive disorder, recurrent severe without psychotic features (HCC) [F33.2] 07/17/2017  . Trichomoniasis [A59.9] 03/24/2016  . Cocaine use [F14.90] 03/24/2016  . ETOH abuse [F10.10] 03/24/2016  . 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 MDD (major depressive disorder), recurrent severe, without psychosis (HCC) [F33.2] 03/23/2016  . Psychoactive substance-induced mood disorder (HCC) [Z61.09, F06.30] 03/23/2016  . Major depressive disorder, recurrent, severe without psychotic behavior (HCC) [F33.2] 03/22/2016  . GAD (generalized anxiety disorder) [F41.1] 03/22/2016  . BV (bacterial vaginosis) [N76.0, B96.89]   . Suicidal ideations [R45.851]   . ADHD (attention deficit hyperactivity disorder) [F90.9] 08/05/2014    Total Time spent with patient: 30 minutes  Musculoskeletal: Strength & Muscle Tone: within normal limits Gait & Station: normal Patient leans: N/A  Psychiatric Specialty Exam: Review of Systems  Constitutional: Negative for chills and fever.  Respiratory: Negative for cough and shortness of breath.   Cardiovascular: Negative for chest pain.  Gastrointestinal: Negative for abdominal pain, heartburn, nausea and vomiting.  Psychiatric/Behavioral: Negative for depression, hallucinations and suicidal ideas. The patient is not nervous/anxious.     Blood pressure 98/67, pulse 94, temperature 98.1 F (36.7 C), temperature source Oral, resp. rate 16, height 5\' 4"  (1.626 m), weight 45.8 kg (101 lb), last menstrual period 07/09/2017, SpO2 100 %.Body mass index is 17.34 kg/m.  General Appearance: Casual and Fairly Groomed  Patent attorney::  Good  Speech:  Clear and Coherent and Normal Rate  Volume:  Normal  Mood:  Euthymic  Affect:  Appropriate and Congruent  Thought Process:   Coherent and Goal Directed  Orientation:  Full (Time, Place, and Person)  Thought Content:  Logical  Suicidal Thoughts:  No  Homicidal Thoughts:  No  Memory:  Immediate;   Fair Recent;   Fair Remote;   Fair  Judgement:  Fair  Insight:  Fair  Psychomotor Activity:  Normal  Concentration:  Good  Recall:  Good  Fund of Knowledge:Good  Language: Good  Akathisia:  No  Handed:    AIMS (if indicated):     Assets:  Communication Skills Resilience  Sleep:  Number of Hours: 6.75  Cognition: WNL  ADL's:  Intact   Mental Status Per Nursing Assessment::   On Admission:  NA  Demographic Factors:  Low socioeconomic status  Loss Factors: Decrease in vocational status  Historical Factors: Impulsivity  Risk Reduction Factors:   Sense of responsibility to family, Positive social support, Positive therapeutic relationship and Positive coping skills or problem solving skills  Continued Clinical Symptoms:  Severe Anxiety and/or Agitation Depression:   Comorbid alcohol abuse/dependence Alcohol/Substance Abuse/Dependencies  Cognitive Features That Contribute To Risk:  None    Suicide Risk:  Minimal: No identifiable suicidal ideation.  Patients presenting with no risk factors but with morbid ruminations; may be classified as minimal risk based on the severity of the depressive symptoms  Follow-up Information    Terrill Mohr Follow up.   Why:  Next therapy appointment is Contact information: [address] [place], [place] [address] [phone]       [facility] Follow up on 07/22/2017.   Why:  Appointment is 07/22/17 at 11:00am.  Contact information: [address] [place] [address] [phone]         Subjective Data:  Kathy Hudson is a 22 y/o F admitted with agitation and disorganization  in the context of illicit substance use of methamphetamine. Pt was started on seroquel, and she reported improvement of her presenting symptoms  during her stay.  Today upon evaluation, pt shares about her initial reasons for coming to the hospital, "I was kind of tweaking; I did a line of speed and I know that's not good for me. I was feeling stuck at my boyfriends and my mind was all over the place. I wasn't sleeping, but I'm feeling a lot better now." Pt denies SI/HI/AH/VH. She is sleeping well and her appetite is good. She is tolerating her medications without side effects or difficulty. She is in agreement to continue her current regimen without changes.  Pt shares that she plans to stay with her mother after discharge, and she plans to attend DBT groups for therapy and to help with preventing relapse of illicit substance use. She was able to engage in safety planning including [facility] or contact emergency services if she feels unable to maintain her own safety or the safety of others. Pt had no further questions, comments, or concerns.   Plan Of Care/Follow-up recommendations:   -Discharge to outpatient level of care  -Psychoactive substance induced mood disorder and MDD    - Continue Seroquel 25 mg po bid.  -Anxiety    - Continue Hydroxyzine 25 mg prn po Q 6 hrs.    - Continue Propranolol 10 mg po bid  -Insomnia    - Continue Trazodone 50 mg po prn Q hs.  Activity:  as tolerated Diet:  normal Tests:  NA Other:  see above for DC plan  Micheal Likenshristopher T Sondra Blixt, MD 07/20/2017, 10:06 AM

## 2017-07-20 NOTE — Progress Notes (Signed)
D:  Patient denied SI and HI, contracts for safety.  Denied A/V hallucinations. A:  Medications administered per MD orders.  Emotional support and encouragement given patient. R:  Safety maintained with 15 minute checks.  Safety maintained with 15 minute checks.  

## 2017-07-20 NOTE — Progress Notes (Signed)
Discharge Note:  Patient discharged home with family member.  Patient denied SI and HI, denied A/V hallucinations.  Denied pain.  Suicide prevention information given and discussed with patient who stated she understood and had no questions.  Patient stated she received all her belongings, clothing, toiletries, misc items, prescriptions, medications, etc.  Patient stated she [facility].  All required discharge information given to patient at discharge.

## 2017-07-20 NOTE — Discharge Summary (Addendum)
Physician Discharge Summary Note  Patient:  Kathy Hudson is an 22 y.o., female MRN:  [id number] DOB:  [birth year] Patient phone:  [phone] (home)  Patient address:   [address] [place] [id number],  Total Time spent with patient: Greater than 30 minutes  Date of Admission:  07/17/2017 Date of Discharge: 07/20/2017  Reason for Admission: Drug (Amphetamine) induced agitation and disorganization.  Principal Problem: Psychoactive substance-induced mood disorder (HCC) D ischarge Diagnoses: Patient Active Problem List   Diagnosis Date Noted  . Psychoactive substance-induced mood disorder (HCC) [O13.08[F19.94, F06.30] 03/23/2016    Priority: High  . Major depressive disorder, recurrent severe without psychotic features (HCC) [F33.2] 07/17/2017  . Trichomoniasis [A59.9] 03/24/2016  . Cocaine use [F14.90] 03/24/2016  . ETOH abuse [F10.10] 03/24/2016  . 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 MDD (major depressive disorder), recurrent severe, without psychosis (HCC) [F33.2] 03/23/2016  . Major depressive disorder, recurrent, severe without psychotic behavior (HCC) [F33.2] 03/22/2016  . GAD (generalized anxiety disorder) [F41.1] 03/22/2016  . BV (bacterial vaginosis) [N76.0, B96.89]   . Suicidal ideations [R45.851]   . ADHD (attention deficit hyperactivity disorder) [F90.9] 08/05/2014   Past Psychiatric History: HX. Amphetamine use disorder, ADHD  Past Medical History:  Past Medical History:  Diagnosis Date  . ADHD (attention deficit hyperactivity disorder)    History reviewed. No pertinent surgical history.  Family History: History reviewed. No pertinent family history.  Family Psychiatric  History: See H&P  Social History:  Social History   Substance and Sexual Activity  Alcohol Use No  . Alcohol/week: 0.0 oz     Social History   Substance and Sexual Activity  Drug Use Yes  . Types: Benzodiazepines, Marijuana    Social History   Socioeconomic History  .  Marital status: Single    Spouse name: None  . Number of children: None  . Years of education: None  . Highest education level: None  Social Needs  . Financial resource strain: None  . Food insecurity - worry: None  . Food insecurity - inability: None  . Transportation needs - medical: None  . Transportation needs - non-medical: None  Occupational History  . None  Tobacco Use  . Smoking status: Light Tobacco Smoker    Types: Cigarettes  . Smokeless tobacco: Never Used  Substance and Sexual Activity  . Alcohol use: No    Alcohol/week: 0.0 oz  . Drug use: Yes    Types: Benzodiazepines, Marijuana  . Sexual activity: Yes    Birth control/protection: None  Other Topics Concern  . None  Social History Narrative  . None   Hospital Course: (Per Md's SRA): Kathy RolesMorgan Hudson is a 22 y/o F admitted with agitation and disorganization in the context of illicit substance use of methamphetamine. Pt was started on seroquel, and she reported improvement of her presenting symptoms during her stay.  Besides the Seroquel for mood stabilization, Kathy Hudson also was medicated & discharged on; Propranolol 10 mg for high anxiety/elevated pulse rate, Hydroxyzine 25 mg prn for anxiety, Nicotine patch 21 mg for smoking cessation & Trazodone 50 mg for insomnia. She was enrolled & participated in the group counseling sessions being offered & held on this unit, she learned coping skills. Kathy Hudson presented no other significant medical issues that required treatment or monitoring. She tolerated her treatment regimen without any adverse effects or reactions reported.  Today upon her discharge evaluation with the attending psychiatrist, pt shares about her initial reasons for coming to the hospital, "I was kind of  tweaking; I did a line of speed and I know that's not good for me. I was feeling stuck at my boyfriends and my mind was all over the place. I wasn't sleeping, but I'm feeling a lot better now." Pt denies  SI/HI/AH/VH. She is sleeping well and her appetite is good. She is tolerating her medications without side effects or difficulty. She is in agreement to continue her current regimen without changes.  Pt shares that she plans to stay with her mother after discharge, and she plans to attend DBT groups for therapy and to help with preventing relapse of illicit substance use. She was able to engage in safety planning including plan to return to [facility] or contact emergency services if she feels unable to maintain her own safety or the safety of others. Pt had no further questions, comments, or concerns.  Upon discharge, Kathy Hudson presents mentally & medically stable. She is recommended to continue mental health care & medication management on an outpatient basis as noted below. She is provided with all the necessary information needed to make this appointment without problems. She left [facility] with all personal belongings in apparent distress. Transportation per mother.  Physical Findings: AIMS: Facial and Oral Movements Muscles of Facial Expression: None, normal Lips and Perioral Area: None, normal Jaw: None, normal Tongue: None, normal,Extremity Movements Upper (arms, wrists, hands, fingers): None, normal Lower (legs, knees, ankles, toes): None, normal, Trunk Movements Neck, shoulders, hips: None, normal, Overall Severity Severity of abnormal movements (highest score from questions above): None, normal Incapacitation due to abnormal movements: None, normal Patient's awareness of abnormal movements (rate only patient's report): No Awareness, Dental Status Current problems with teeth and/or dentures?: No Does patient usually wear dentures?: No  CIWA:    COWS:     Musculoskeletal: Strength & Muscle Tone: within normal limits Gait & Station: normal Patient leans: N/A  Psychiatric Specialty Exam: Physical Exam  Nursing note and vitals reviewed. Constitutional: She appears well-developed.  HENT:  Head:  Normocephalic.  Eyes: Pupils are equal, round, and reactive to light.  Neck: Normal range of motion.  Cardiovascular: Normal rate.  Respiratory: Effort normal.  GI: Soft.  Genitourinary:  Genitourinary Comments: Deferred  Musculoskeletal: Normal range of motion.  Neurological: She is alert.  Skin: Skin is warm.  Psychiatric: She has a normal mood and affect. Her speech is normal and behavior is normal. Judgment and thought content normal. Cognition and memory are normal.    Review of Systems  Constitutional: Negative.   HENT: Negative.   Eyes: Negative.   Respiratory: Negative.   Cardiovascular: Negative.   Gastrointestinal: Negative.   Genitourinary: Negative.   Musculoskeletal: Negative.   Skin: Negative.   Neurological: Negative.   Endo/Heme/Allergies: Negative.   Psychiatric/Behavioral: Positive for depression (Stable), hallucinations (Hx. Psychosis) and substance abuse (Hx. Amphetamine & THC use disorders). Negative for memory loss and suicidal ideas. The patient has insomnia (Stable). The patient is not nervous/anxious.     Blood pressure 98/67, pulse 94, temperature 98.1 F (36.7 C), temperature source Oral, resp. rate 16, height 5\' 4"  (1.626 m), weight 45.8 kg (101 lb), last menstrual period 07/09/2017, SpO2 100 %.Body mass index is 17.34 kg/m.   Have you used any form of tobacco in the last 30 days? (Cigarettes, Smokeless Tobacco, Cigars, and/or Pipes): Yes  Has this patient used any form of tobacco in the last 30 days? (Cigarettes, Smokeless Tobacco, Cigars, and/or Pipes):Yes, an FDA-approved tobacco cessation medication was offered at discharge.  Blood Alcohol level:  Lab Results  Component Value Date   ETH <10 07/16/2017   ETH <5 03/21/2016   Metabolic Disorder Labs:  No results found for: HGBA1C, MPG No results found for: PROLACTIN No results found for: CHOL, TRIG, HDL, CHOLHDL, VLDL, LDLCALC  See Psychiatric Specialty Exam and Suicide Risk Assessment  completed by Attending Physician prior to discharge.  Discharge destination:  Home  Is patient on multiple antipsychotic therapies at discharge:  No   Has Patient had three or more failed trials of antipsychotic monotherapy by history:  No  Recommended Plan for Multiple Antipsychotic Therapies: NA  Allergies as of 07/20/2017   No Known Allergies     Medication List    STOP taking these medications   ARIPiprazole 10 MG tablet Commonly known as:  ABILIFY   busPIRone 10 MG tablet Commonly known as:  BUSPAR   metroNIDAZOLE 500 MG tablet Commonly known as:  FLAGYL   nicotine 14 mg/24hr patch Commonly known as:  NICODERM CQ - dosed in mg/24 hours Replaced by:  nicotine 21 mg/24hr patch     TAKE these medications     Indication  hydrOXYzine 25 MG tablet Commonly known as:  ATARAX/VISTARIL Take 1 tablet (25 mg total) by mouth every 6 (six) hours as needed (mild anxiety). What changed:    medication strength  how much to take  reasons to take this  Indication:  Feeling Anxious   nicotine 21 mg/24hr patch Commonly known as:  NICODERM CQ - dosed in mg/24 hours Place 1 patch (21 mg total) onto the skin daily. (May purchase from over the counter): For smoking cessation Start taking on:  07/21/2017 Replaces:  nicotine 14 mg/24hr patch  Indication:  Nicotine Addiction   propranolol 10 MG tablet Commonly known as:  INDERAL Take 1 tablet (10 mg total) by mouth 2 (two) times daily. For anxiety/increased pulse rate  Indication:  Anxiety, high pulse rate   QUEtiapine 25 MG tablet Commonly known as:  SEROQUEL Take 1 tablet (25 mg total) by mouth 2 (two) times daily. For mood control  Indication:  Agitation   traZODone 50 MG tablet Commonly known as:  DESYREL Take 1 tablet (50 mg total) by mouth at bedtime as needed for sleep.  Indication:  Trouble Sleeping      Follow-up Information    [facility] Follow up.   Why:  Next therapy appointment is Contact  information: [address] [place], [place] [address] [phone]       [facility] up on 07/22/2017.   Why:  Appointment is 07/22/17 at 11:00am.  Contact information: [address] [place] [address] [phone]          Follow-up recommendations: Activity:  As tolerated Diet: As recommended by your primary care doctor. Keep all scheduled follow-up appointments as recommended.    Comments: Patient is instructed prior to discharge to: Take all medications as prescribed by his/her mental healthcare provider. Report any adverse effects and or reactions from the medicines to his/her outpatient provider promptly. Patient has been instructed & cautioned: To not engage in alcohol and or illegal drug use while on prescription medicines. In the event of worsening symptoms, patient is instructed to call the crisis hotline, 911 and or go to the nearest ED for appropriate evaluation and treatment of symptoms. To follow-up with his/her primary care provider for your other medical issues, concerns and or health care needs.    Signed: Armandina Stammer, NP, pmhnp, Fnp- [facility] 07/20/2017, 10:04 AM  Patient seen, Suicide Assessment Completed.  Disposition Plan Reviewed

## 2018-10-14 IMAGING — CR DG CHEST 2V
2 series · 2 of 2 positions shown · non-contrast
Comparison: None.

CLINICAL DATA: Leukocytosis

EXAM:
CHEST - 2 VIEW

[w chest pa]
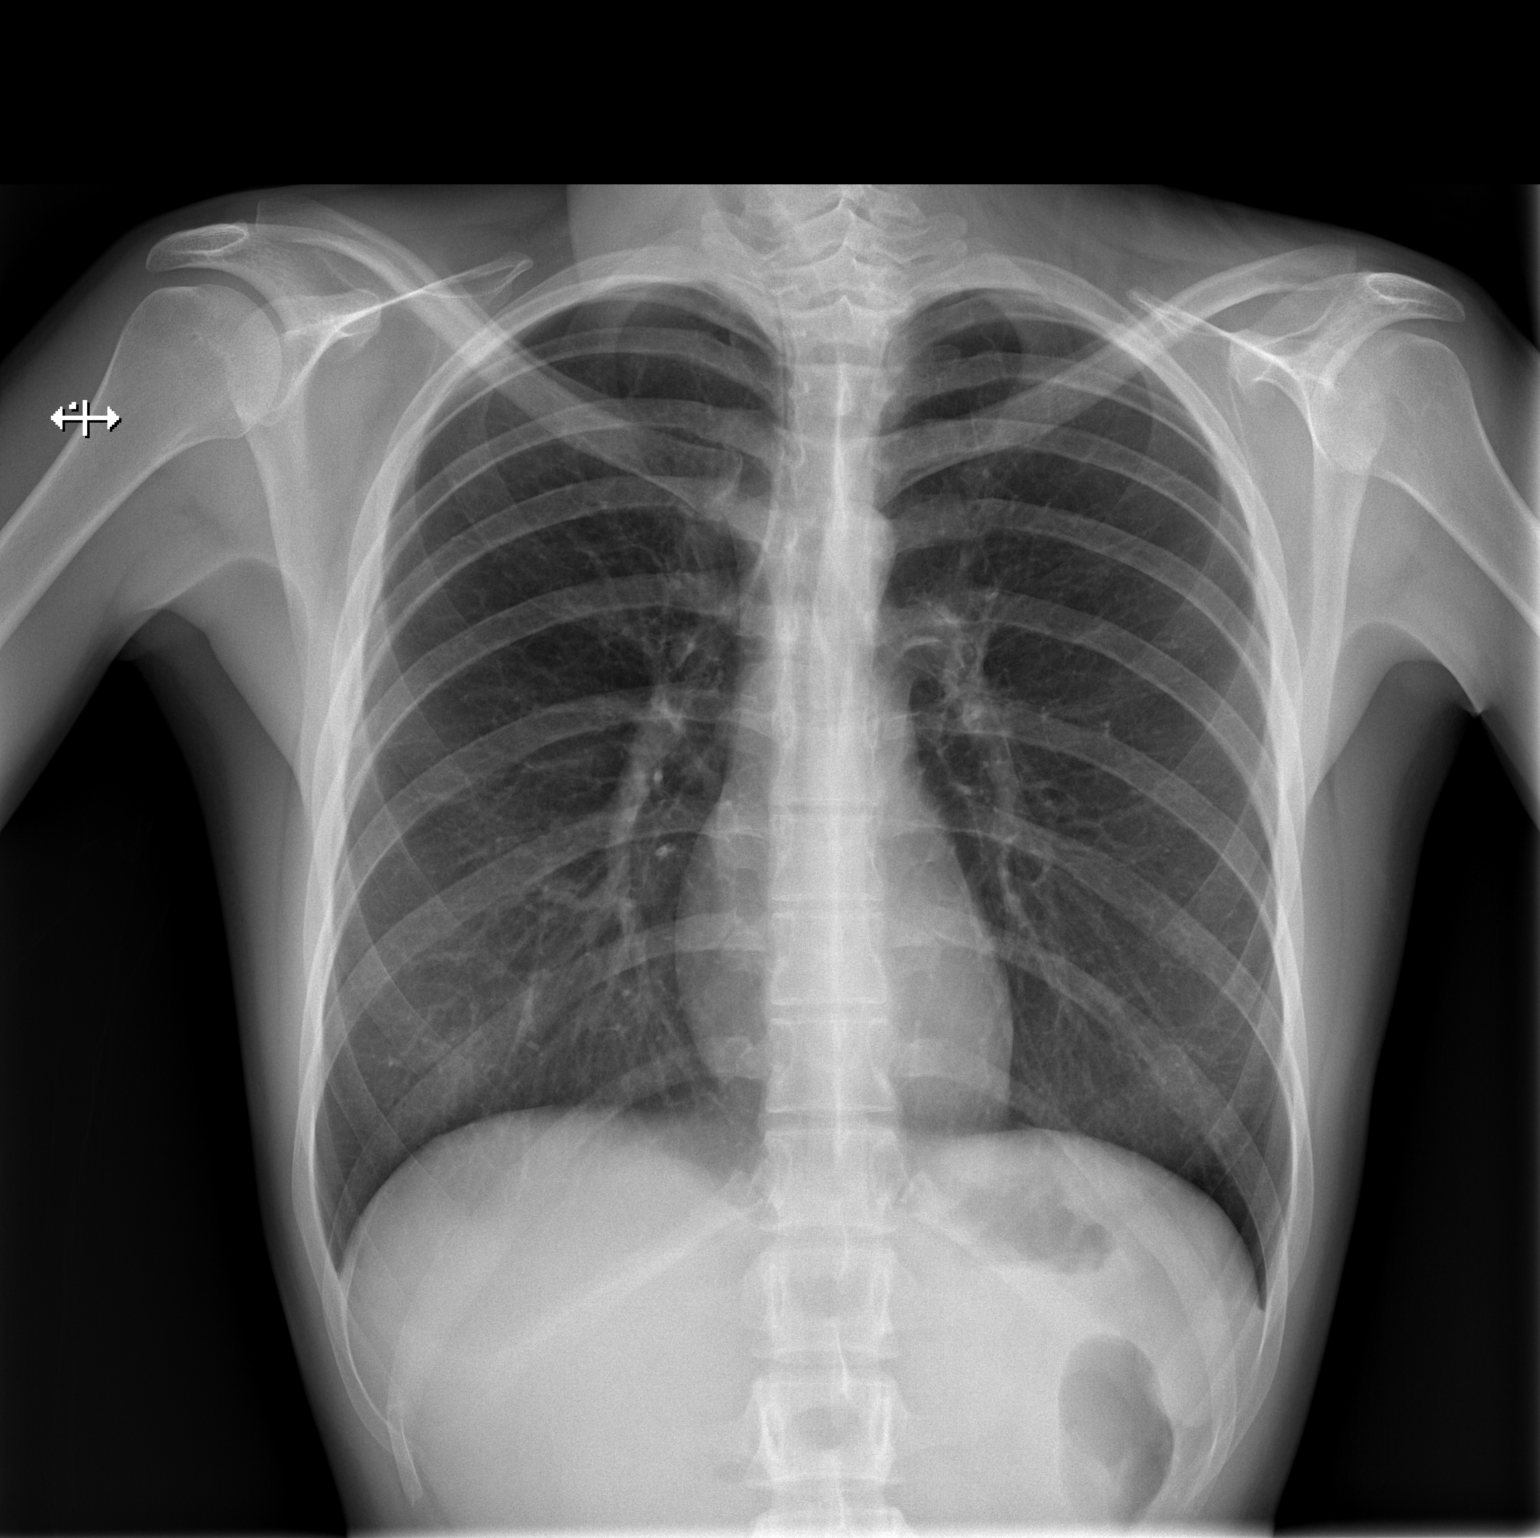

[w chest lat]
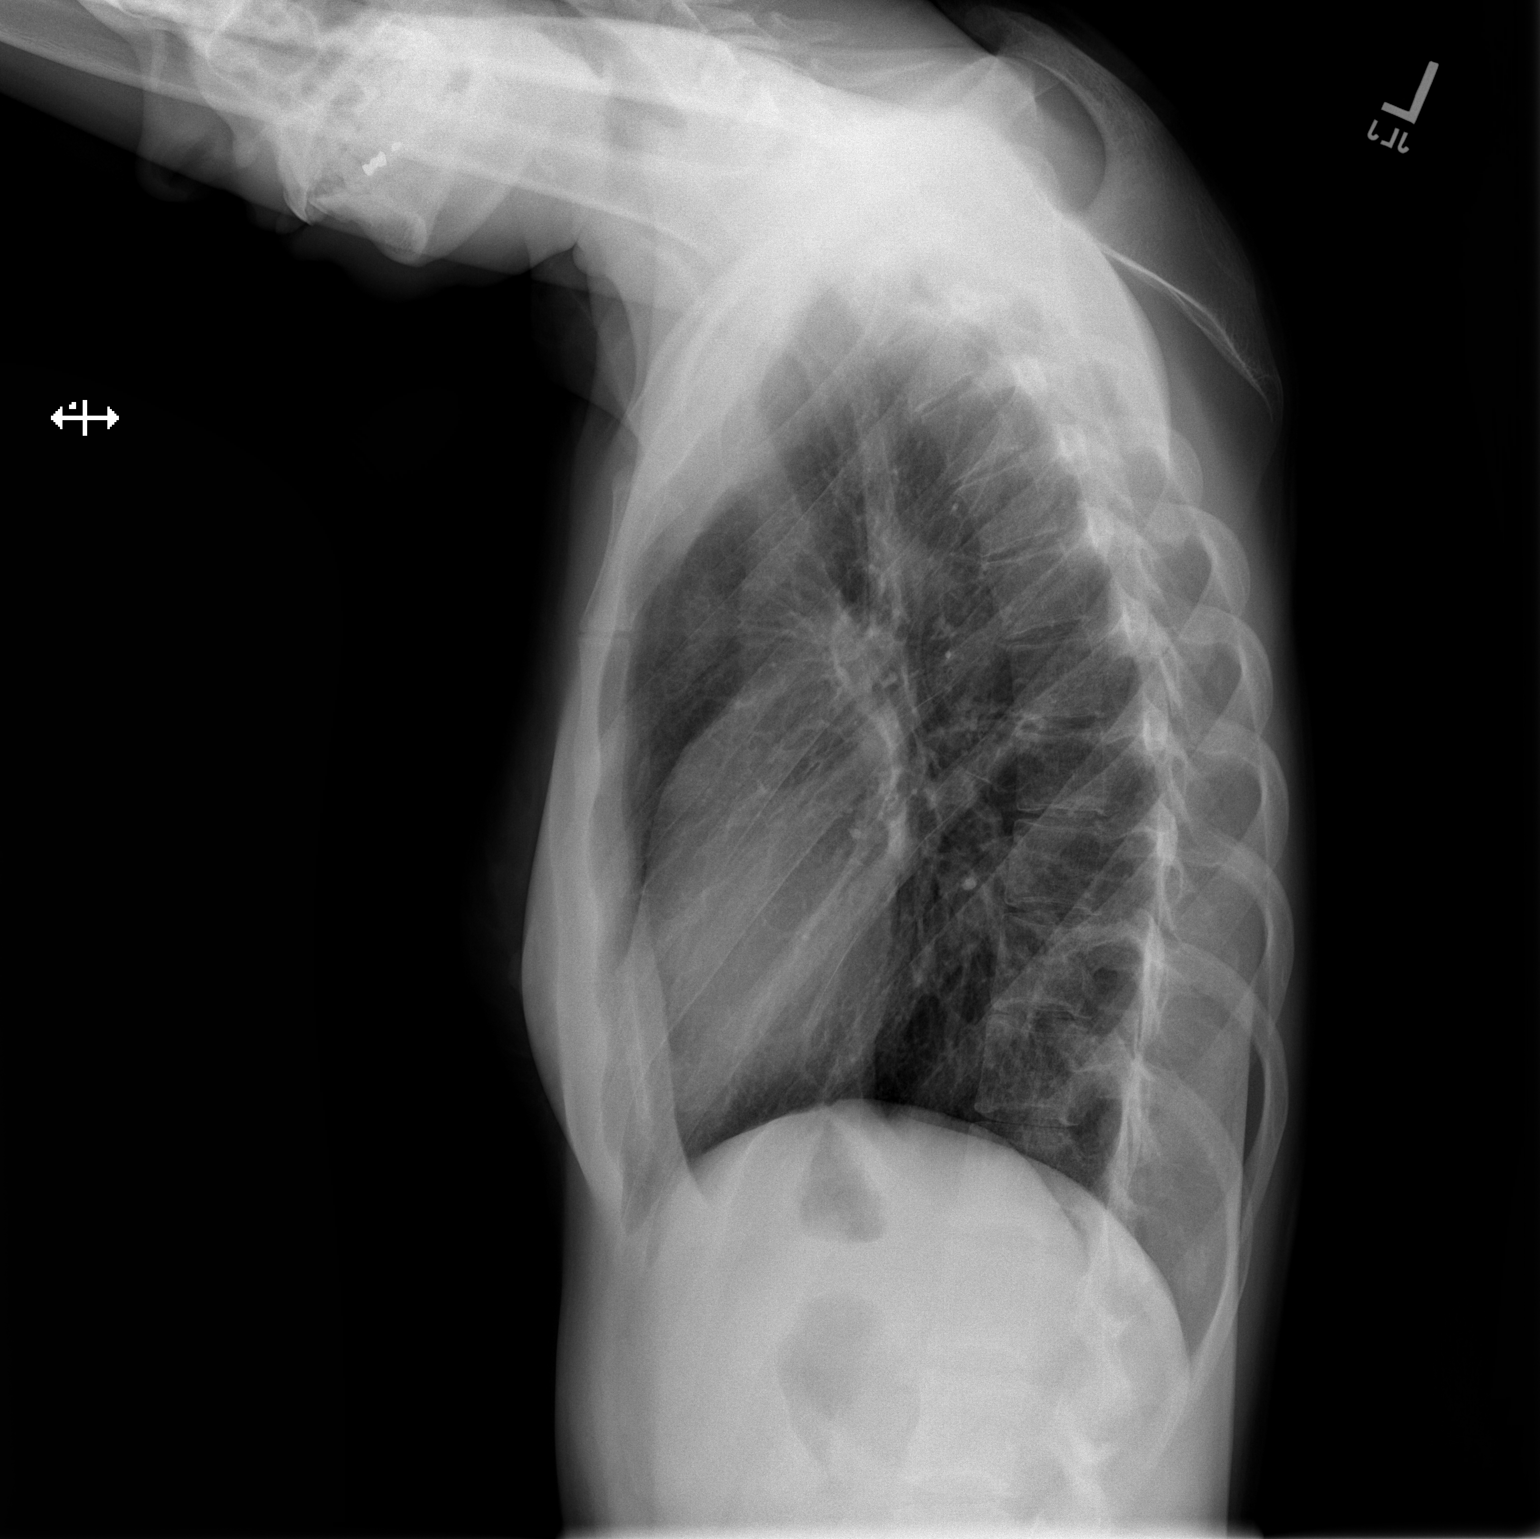

[2 of 2 positions shown; findings below may reference images not displayed]

FINDINGS: The heart size and mediastinal contours are within normal limits.
Both lungs are clear. The visualized skeletal structures are
unremarkable.
IMPRESSION: No active cardiopulmonary disease.

## 2020-12-01 ENCOUNTER — Inpatient Hospital Stay
Admit: 2020-12-01 | Discharge: 2020-12-02 | Disposition: A | Discharge status: Home / Self Care | Source: Home / Self Care | Attending: Emergency Medical Services

## 2020-12-01 DIAGNOSIS — F1123 Opioid dependence with withdrawal: Secondary | ICD-10-CM

## 2020-12-01 MED ADMIN — ONDANSETRON 4 MG PO TBDP: 4 mg | ORAL | @ 21:00:00 | Stop: 2020-12-01 | NDC 57237007710
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
## 2020-12-02 LAB — COVID-19 PCR: COVID-19 PCR/TMA: NOT DETECTED

## 2020-12-02 MED ORDER — LORAZEPAM 1 MG PO TABS
.5 mg | ORAL_TABLET | Freq: Three times a day (TID) | ORAL | 0 refills | Status: AC | PRN
Start: 2020-12-02 — End: ?

## 2020-12-02 MED ADMIN — KETOROLAC TROMETHAMINE 30 MG/ML IJ SOLN: 15 mg | INTRAMUSCULAR | @ 01:00:00 | Stop: 2020-12-02 | NDC 72611072201

## 2020-12-02 MED ADMIN — BUPRENORPHINE HCL 2 MG SL SUBL: 2 mg | SUBLINGUAL | @ 04:00:00 | Stop: 2020-12-08 | NDC 60687048111

## 2020-12-02 MED ADMIN — PHENOBARBITAL SODIUM 130 MG/ML IJ SOLN: 260 mg | INTRAVENOUS | @ 01:00:00 | Stop: 2020-12-02

## 2020-12-02 MED ADMIN — ONDANSETRON HCL 4 MG/2ML IJ SOLN: 4 mg | INTRAVENOUS | @ 06:00:00 | Stop: 2020-12-02 | NDC 00641607801

## 2020-12-02 MED ADMIN — LORAZEPAM 1 MG PO TABS: 1 mg | ORAL | @ 08:00:00 | Stop: 2020-12-02 | NDC 00904600861

## 2020-12-02 MED ADMIN — SODIUM CHLORIDE 0.9 % IV BOLUS: 1000 mL | INTRAVENOUS | @ 01:00:00 | Stop: 2020-12-02 | NDC 00338004904

## 2020-12-02 MED ADMIN — PHENOBARBITAL SODIUM 130 MG/ML IJ SOLN: 130 mg | INTRAVENOUS | @ 01:00:00 | Stop: 2020-12-02 | NDC 00641047721

## 2020-12-02 MED ADMIN — PHENOBARBITAL SODIUM 130 MG/ML IJ SOLN: 130 mg | INTRAVENOUS | @ 06:00:00 | Stop: 2020-12-02 | NDC 00641047721

## 2020-12-02 MED ADMIN — PHENOBARBITAL SODIUM 130 MG/ML IJ SOLN: 130 mg | INTRAVENOUS | @ 02:00:00 | Stop: 2020-12-02 | NDC 00641047721

## 2020-12-02 NOTE — ED Notes
Report given to Nestor, RN.

## 2020-12-02 NOTE — Discharge Instructions
[facility]    Thank you for visiting the [facility] Emergency Department!    You have been evaluated in the [facility] Emergency Department today for withdrawal. You have been given a short prescription medication to help with your symptoms. We have also referred you to a substance use navigator who will contact you.      Please follow up with your primary care physician within two days. If you do not have a primary doctor, you can call [phone] to schedule an appointment with a [place] primary care physician.     Return to the Emergency Department if you experience uncontrollable vomiting, fever, abdominal pain or any other ccncerning symptom.     Thank you for choosing [facility] for your care.

## 2020-12-02 NOTE — ED Notes
Requested assistance from security for discharging the patient

## 2020-12-02 NOTE — ED Notes
Pt is for discharge; but requesting to be stay; requested to talk to the ER MD; made aware

## 2020-12-02 NOTE — ED Notes
Called security because pt doesn't want to leave

## 2020-12-02 NOTE — Consults
SW met pt at bedside to assess for homelessness and support systems. SW introduce self and role of services. Pt reported not having much family support but expressed living with boyfriend who is also admitted here at the hospital. Pt reported needing tx for substance abuse and stated '' Yeah me and boyfriend are going to do it together so I am not alone''. SW inquired about cell phone and pt reported that mother or [employer] will most likely will be able to provide one to her before her appointment. SW encouraged pt contact SW if any thing changes.           Para Skeans, MSW

## 2022-07-23 ENCOUNTER — Inpatient Hospital Stay
Admission: EM | Admit: 2022-07-23 | Discharge: 2022-07-27 | DRG: 885 | Disposition: A | Discharge status: Residential Facility | Payer: 59 | Attending: Psychiatry | Admitting: Psychiatry

## 2022-07-23 DIAGNOSIS — F1190 Opioid use, unspecified, uncomplicated: Secondary | ICD-10-CM

## 2022-07-23 DIAGNOSIS — N390 Urinary tract infection, site not specified: Secondary | ICD-10-CM | POA: Diagnosis present

## 2022-07-23 DIAGNOSIS — R440 Auditory hallucinations: Principal | ICD-10-CM

## 2022-07-23 DIAGNOSIS — Z560 Unemployment, unspecified: Secondary | ICD-10-CM

## 2022-07-23 DIAGNOSIS — F4310 Post-traumatic stress disorder, unspecified: Secondary | ICD-10-CM | POA: Diagnosis present

## 2022-07-23 DIAGNOSIS — N3000 Acute cystitis without hematuria: Secondary | ICD-10-CM

## 2022-07-23 DIAGNOSIS — F17290 Nicotine dependence, other tobacco product, uncomplicated: Secondary | ICD-10-CM | POA: Diagnosis present

## 2022-07-23 DIAGNOSIS — Z814 Family history of other substance abuse and dependence: Secondary | ICD-10-CM

## 2022-07-23 DIAGNOSIS — F600 Paranoid personality disorder: Secondary | ICD-10-CM | POA: Diagnosis present

## 2022-07-23 DIAGNOSIS — F259 Schizoaffective disorder, unspecified: Secondary | ICD-10-CM | POA: Diagnosis present

## 2022-07-23 DIAGNOSIS — Z1152 Encounter for screening for COVID-19: Secondary | ICD-10-CM

## 2022-07-23 DIAGNOSIS — F29 Unspecified psychosis not due to a substance or known physiological condition: Secondary | ICD-10-CM | POA: Diagnosis present

## 2022-07-23 DIAGNOSIS — F250 Schizoaffective disorder, bipolar type: Principal | ICD-10-CM | POA: Diagnosis present

## 2022-07-23 DIAGNOSIS — Z79899 Other long term (current) drug therapy: Secondary | ICD-10-CM

## 2022-07-23 HISTORY — DX: Thyrotoxicosis, unspecified without thyrotoxic crisis or storm: E05.90

## 2022-07-23 HISTORY — DX: Panic disorder (episodic paroxysmal anxiety): F41.0

## 2022-07-23 HISTORY — DX: Schizoaffective disorder, bipolar type (CMS-HCC): F25.0

## 2022-07-23 LAB — DRUG SCREEN RAPID PANEL 12 NO CONFIRMATION, URINE
Amphetamines, Urine: NOT DETECTED
Barbiturates: NOT DETECTED
Benzodiazepines: NOT DETECTED
Cocaine: NOT DETECTED
Fentanyl,  U Scrn: NOT DETECTED
MDMA: NOT DETECTED
Methadone: NOT DETECTED
Opiates: NOT DETECTED
Oxycodone Lvl, UR: NOT DETECTED
PCP: NOT DETECTED
Propoxyphene: NOT DETECTED
THC: NOT DETECTED

## 2022-07-23 LAB — COMPREHENSIVE METABOLIC PANEL, BLOOD
ALT: 11 U/L (ref 7–52)
AST: 14 U/L (ref 13–39)
Albumin: 4.7 G/DL (ref 3.7–5.3)
Alk Phos: 96 U/L (ref 34–104)
BUN: 24 mg/dL (ref 7–25)
Bilirubin, Total: 0.4 mg/dL (ref 0.0–1.4)
CO2: 28 mmol/L (ref 21–31)
Calcium: 9.7 mg/dL (ref 8.6–10.3)
Chloride: 106 mmol/L (ref 98–107)
Creat: 0.9 mg/dL (ref 0.6–1.2)
Electrolyte Balance: 8 mmol/L (ref 2–12)
Glucose: 92 mg/dL (ref 70–115)
Potassium: 4.3 mmol/L (ref 3.5–5.1)
Protein, Total: 7.6 G/DL (ref 6.0–8.3)
Sodium: 142 mmol/L (ref 136–145)
eGFR - high estimate: >60 (ref >59)
eGFR - low estimate: >60 (ref >59)

## 2022-07-23 LAB — URINALYSIS
Bilirubin, UA: NEGATIVE
Glucose, UA: NEGATIVE MG/DL
Hemoglobin, UA: NEGATIVE
Ketones, UA: NEGATIVE MG/DL
Nitrite, UA: NEGATIVE
Protein, UA: 20 MG/DL — AB
RBC, UA: 15 #/HPF — ABNORMAL HIGH (ref 0–3)
Specific Grav, UA: 1.026 (ref 1.003–1.030)
Squamous Epithelial, UA: 3 /HPF (ref 0–10)
Urobilinogen, UA: <2 MG/DL (ref <2)
WBC, UA: 30 #/HPF — ABNORMAL HIGH (ref 0–5)
pH, UA: 7.5 (ref 5.0–8.0)

## 2022-07-23 LAB — CBC WITH DIFF, BLOOD
ANC automated: 7.7 10*3/uL (ref 2.0–8.1)
Basophils %: 0.9 %
Basophils Absolute: 0.1 10*3/uL (ref 0.0–0.2)
Eosinophils %: 4.6 %
Eosinophils Absolute: 0.6 10*3/uL — ABNORMAL HIGH (ref 0.0–0.5)
Hematocrit: 38.6 % (ref 34.0–44.0)
Hgb: 12.9 G/DL (ref 11.5–15.0)
Lymphocytes %: 29.1 %
Lymphocytes Absolute: 4 10*3/uL — ABNORMAL HIGH (ref 0.9–3.3)
MCH: 28.5 PG (ref 27.0–33.5)
MCHC: 33.5 G/DL (ref 32.0–35.5)
MCV: 85.1 FL (ref 81.5–97.0)
MPV: 8.5 FL (ref 7.2–11.7)
Monocytes %: 9.3 %
Monocytes Absolute: 1.3 10*3/uL — ABNORMAL HIGH (ref 0.0–0.8)
Neutrophils % (A): 56.1 %
PLT Count: 441 10*3/uL — ABNORMAL HIGH (ref 150–400)
RBC: 4.54 10*6/uL (ref 3.70–5.00)
RDW-CV: 14.3 % (ref 11.6–14.4)
White Bld Cell Count: 13.7 10*3/uL — ABNORMAL HIGH (ref 4.0–10.5)

## 2022-07-23 LAB — CORONAVIRUS DISEASE 2019 (COVID-19) SCOVS
COVID-19 Comment: NOT DETECTED
COVID-19 Result: NOT DETECTED

## 2022-07-23 LAB — THYROID FUNCTION PANEL (ULTRASENSITIVE TSH + FREE T4)
Free T4: 0.74 ng/dL (ref 0.60–1.12)
TSH, Ultrasensitive: 1.747 u[IU]/mL (ref 0.450–4.120)

## 2022-07-23 LAB — URINE HCG: Preg, UR (Qual): NEGATIVE

## 2022-07-23 LAB — HCG QUANTITATIVE, BLOOD: Beta hCG: <1 m[IU]/mL

## 2022-07-23 MED ORDER — BUPRENORPHINE HCL 2 MG SL SUBL
4.0000 mg | SUBLINGUAL_TABLET | Freq: Once | SUBLINGUAL | Status: AC
Start: 2022-07-23 — End: 2022-07-23
  Administered 2022-07-23: 4 mg via SUBLINGUAL
  Filled 2022-07-23 (×2): qty 2

## 2022-07-23 MED ORDER — LACTATED RINGERS IV BOLUS (~~LOC~~)
1000.0000 mL | Freq: Once | INTRAVENOUS | Status: DC
Start: 2022-07-23 — End: 2022-07-23

## 2022-07-23 MED ORDER — LORAZEPAM 1 MG OR TABS
1.0000 mg | ORAL_TABLET | Freq: Four times a day (QID) | ORAL | Status: DC | PRN
Start: 2022-07-23 — End: 2022-07-25
  Administered 2022-07-23 – 2022-07-25 (×5): 1 mg via ORAL
  Filled 2022-07-23 (×5): qty 1

## 2022-07-23 MED ORDER — QUETIAPINE FUMARATE 50 MG OR TABS
100.0000 mg | ORAL_TABLET | Freq: Two times a day (BID) | ORAL | Status: DC
Start: 2022-07-24 — End: 2022-07-27
  Administered 2022-07-24 – 2022-07-27 (×8): 100 mg via ORAL
  Filled 2022-07-23 (×8): qty 2

## 2022-07-23 MED ORDER — QUETIAPINE FUMARATE 200 MG OR TABS
600.0000 mg | ORAL_TABLET | Freq: Every evening | ORAL | Status: DC
Start: 2022-07-23 — End: 2022-07-27
  Administered 2022-07-23 – 2022-07-26 (×4): 600 mg via ORAL
  Filled 2022-07-23 (×4): qty 3

## 2022-07-23 MED ORDER — NICOTINE POLACRILEX 2 MG MT GUM
2.0000 mg | CHEWING_GUM | OROMUCOSAL | Status: DC | PRN
Start: 2022-07-23 — End: 2022-07-26
  Administered 2022-07-23 – 2022-07-26 (×12): 2 mg via ORAL
  Filled 2022-07-23 (×12): qty 1

## 2022-07-23 MED ORDER — CEFDINIR 300 MG OR CAPS
300.0000 mg | ORAL_CAPSULE | Freq: Two times a day (BID) | ORAL | Status: DC
Start: 2022-07-23 — End: 2022-07-27
  Administered 2022-07-23 – 2022-07-27 (×8): 300 mg via ORAL
  Filled 2022-07-23 (×8): qty 1

## 2022-07-23 MED ORDER — LITHIUM CARBONATE 300 MG OR CAPS
300.0000 mg | ORAL_CAPSULE | Freq: Two times a day (BID) | ORAL | Status: DC
Start: 2022-07-23 — End: 2022-07-24
  Administered 2022-07-23 – 2022-07-24 (×2): 300 mg via ORAL
  Filled 2022-07-23 (×2): qty 1

## 2022-07-23 NOTE — ED Notes (Signed)
Assumed care of the patient at this time. Patient is A/Ox4, VSS, no signs of distress noted and resting in day room with even, unlabored respirations. Will continue to monitor.

## 2022-07-23 NOTE — Interdisciplinary (Addendum)
Social Work Assessment        Patient Name:  Jill Wood   MRN: [id number]   Date of Birth: 05-17-1995    Age: 27 year old   Date of Admission:  07/23/2022         Service Date: July 23, 2022     Assessment  Assessment Type: Progress/Follow-up;Discharge    Referral Information  Referral Type: Mental Health Assessment   Social Assessment  Where was the patient admitted from? *: Acute Rehab  Mode of Arrival: Market researcher) *: Self  Permission to Contact *: Not Applicable  Is Patient Minor?: No  Interpreter Used?: Not Needed  Living Arrangements on Admission*: Other ([place])  Post Acute Services Referred To: Inpatient Psych  Post Acute Services Name, Number, Contact Details: Pending inpatient psych placement  Post Acute Resources Provided: [place];Other (Comment) (Pending inpatient psych placement)  A List of Community Resources and/or Shelters Provided: Not Applicable  Available Assistance/Support System *: Spouse / significant other  Type of Residence *: [place] *: Other (Comment) (Pending inpatient psych placement)  Additional Services on Admission: Other (Comment) (Pending inpatient psych placement)  Do You Have Transportation Issues/Concerns That Make It Difficult To Get To Your Appointments? : No  Has discharge transport been arranged?: No  Discharge Transportation Details * : Pending inpatient psych placement  Transportation Company/Phone Number * : Pending inpatient psych placement  Transportation* : Other (Comment) (Pending inpatient psych placement)  Patient Engaged in Discharge Planning *: No (Pending inpatient psych placement)  Family/Caregiver's Assessed for *: Not Applicable  Respite Care *: Not Applicable  Patient/Family/Other Are In Agreement With Discharge Plan *: To be determined  Primary Care Access: None     Social Determinants of Health  Living Arrangements on Admission*: Other ([place])  Post Acute Services Referred To:  Inpatient Psych  Post Acute Services Name, Number, Contact Details: Pending inpatient psych placement  Post Acute Resources Provided: [place];Other (Comment) (Pending inpatient psych placement)  A List of Community Resources and/or Shelters Provided: Not Applicable  Available Assistance/Support System *: Spouse / significant other  Type of Residence *: [place] *: Other (Comment) (Pending inpatient psych placement)  Additional Services on Admission: Other (Comment) (Pending inpatient psych placement)  Do You Have Transportation Issues/Concerns That Make It Difficult To Get To Your Appointments? : No  Has discharge transport been arranged?: No  Discharge Transportation Details * : Pending inpatient psych placement  Transportation Company/Phone Number * : Pending inpatient psych placement  Transportation* : Other (Comment) (Pending inpatient psych placement)  Patient Engaged in Discharge Planning *: No (Pending inpatient psych placement)  Family/Caregiver's Assessed for *: Not Applicable  Respite Care *: Not Applicable  Patient/Family/Other Are In Agreement With Discharge Plan *: To be determined  Primary Care Access: None    Income Information  Income Source: Unknown    Mental Health Assessment  Past Mental Health Issues: Defer to psych note for information  Mental Status: Other (Comment) (Defer to psych note for information)  Behavioral Assessment: Other (Comment) (Defer to psych note for information)  Physical Assessment: Other (Comment) (Defer to psych note for information)    Substance Abuse History (CAGE-AID)  Substance Abuse History: Defer to psych note for information    Referral To  Substance Abuse Referral: Your next level of care provider will manage your addictions treatment  Community Resources: Other (Comment) (Pending inpatient psych placement)  Readmission Risk Assessment  Do You Have Transportation Issues/Concerns That Make It Difficult To Get To  Your Appointments? : No    Plans/Interventions/Discharge  Plan/Interventions: Other (comment) (Pending inpatient psych placement)  Anticipated Discharge Destination: Other (Comment) (Pending inpatient psych placement)  Discharge Resources Given: Pending inpatient psych placement  Barriers to Discharge *: Clinical reason;Other (Comment) (Pending inpatient psych placement)    CSW completed mental health assessment for patient via chart review. Patient is a 27 year old, female, brought in by self from [facility] for worsening auditory and visual hallucinations and mania. Patient was evaluated by psychiatry and meets criteria to be placed on a grave disability. CSW spoke with [facility] and was informed they will evaluate patient to see if they will be admitted to [facility] inpatient psych. CSW to await call back from [facility] for patient to refer out vs [facility] inpatient psych admit.     Plan: Patient remains in the ED pending AN3 evaluation to refer out for inpatient psych placement vs [facility] inpatient psych admit.     Hibah Odonnell Vergia [place], [place]     Date: 07/23/2022    Time: 11:50 PM      ADDENDUM    @1220AM - CSW was requested by [facility] to obtain auth for patient to be admitted to inpatient psych. CSW called [facility] [phone]) and spoke with Miozo B, operator. Reference number provided as [id number]. Per insurance, a clinician will call back to complete authorization.     @1250AM - CSW received a call back form Varney Biles, clinician to complete authorization. Patient authorized stay from 3/15-3/19. Review due on 3/19. Auth number provided as [id number]. CSW notified AN3 of [place] information.    Plan: Patient is pending admission to [facility] under Dr. Erling Conte.

## 2022-07-23 NOTE — ED Provider Notes (Signed)
CHIEF COMPLAINT:   Mental Health Problem (Sent from rehab facility, +AVH voices telling pt "kill yourself" constantly after stopping Ativan, -SI/+HI, although reports "not hard to not act on voices"; hx shizo bipolar)     HISTORY OF PRESENT ILLNESS:   Interpreter used: No (English Preferred Language)    Jill Wood is a 27 year old female with PMHx Schizoaffective Bipolar who presents with mental health concern.    Reports increased AVH today, a "narration of everything I do. It's not just in my head. You should just kill yourself".     Recently stopped ativan 12 days ago , symptoms began a week or two ago.     On 400 mg Seroquel BID, propranolol, robaxin, suboxone (for fentanyl, quit about two weeks ago)    Guarded whether SI/HI. Associated nausea, diarrhea non-bloody     From [facility] treatment.     She has been hospitalized for this in the past. +psychiatrist.     Denies chest pain, SOB, vomiting, headache, abdominal pain               PAST MEDICAL HISTORY:  Past Medical History:   Diagnosis Date    Hyperthyroidism     Panic disorder     Schizoaffective disorder, bipolar type (CMS-HCC)       Patient Active Problem List    Diagnosis Date Noted    Schizoaffective disorder (CMS-HCC) 07/24/2022    Unspecified psychosis not due to a substance or known physiological condition (CMS-HCC) 07/23/2022     SURGICAL HISTORY:  Past Surgical History:   Procedure Laterality Date    DILATION AND CURETTAGE OF UTERUS          ALLERGIES:  Allergies   Allergen Reactions    Haldol [Haloperidol] Other     EPS    Zyprexa [Olanzapine] Other     EPS        FAMILY HISTORY:  Reviewed and considered non-contributory     SOCIAL HISTORY/DETERMINANTS OF HEALTH:  Social History     Socioeconomic History    Marital status: Single   Tobacco Use    Smoking status: Never    Smokeless tobacco: Current   Substance and Sexual Activity    Alcohol use: Not Currently     Comment: Last use 18 days ago    Drug use: Not Currently     Types:  Methamphetamines, Opiates (Morphine/Heroin/Oxycodone/Hydrocodone/Fentanyl)     Comment: Last use 18 days ago     Social Determinants of Health     Food Insecurity: No Food Insecurity (07/24/2022)    Hunger Vital Sign     Worried About Programme researcher, broadcasting/film/video in the Last Year: Never true     Ran Out of Food in the Last Year: Never true   Transportation Needs: No Transportation Needs (07/24/2022)    PRAPARE - Therapist, art (Medical): No     Lack of Transportation (Non-Medical): No   Intimate Partner Violence: Low Risk  (07/24/2022)    UC IPV     Have you ever been emotionally or physically abused by your partner or someone important to you?: No     Within the last year have you been hit, slapped, kicked or otherwise physically hurt by someone?: No     Within the last year has anyone forced you to have sexual activities?: No     Are you afraid of your spouse or partner you listed above?: No  Housing Stability: Low Risk  (07/24/2022)    Housing Stability Vital Sign     Unable to Pay for Housing in the Last Year: No     Number of Places Lived in the Last Year: 0     Unstable Housing in the Last Year: No      VITAL SIGNS:  First Vitals [07/23/22 1754]   Temperature Heart Rate Respirations Blood pressure (BP) SpO2   98 F (36.7 C) 115 16 136/83 100 %     PHYSICAL EXAM:  General: Awake, alert, in no apparent distress.  Head: Normocephalic, atraumatic.  Respiratory: Breathing comfortably and in no respiratory distress. No audible wheezing or stridor.   Cardiovascular: Tachycardic, Extremities are warm and well perfused.  Neuro: Awake and alert. Moving extremities.  Skin: - rashes, abscesses, or lacerations  Psych: + SI, + HI, + AVH  Physical Exam       SMART Medical Clearance:   (If all answers are NO, then patient is medically stable for psychiatry evaluation)    Suspect New Onset Psychiatric Condition?: -    Medical Screening:  -Pregnant: -  -Diabetes with Fasting Blood Sugar <60 or >250:  -    Abnormal?:  -Vital Signs Unstable? -  -Alert and oriented < 3? -  -Clinically Intoxicated? -    Risky Presentation?:  -Age (<12 or >55): -  -Toxic Ingestion: -  -Eating Disorder: -  -Daily Alcohol Use for at least 2 weeks (high potential for alcohol withdrawal): -  -Ill-Appearing, Significant Injury, Found Down: -    Therapeutic Levels Needed?:  -Phenytoin (Dilantin): -  -Warfarin (Coumadin): -  -Valproic Acid (Depakote): -  -Lithium: -  -Digoxin: -         MEDICAL DECISION MAKING:    Initial Impressions:  Jill Wood is a 27 year old female who presents with AH/VH.   Vital signs and physical exam were notable for:     VS significant for tachycardia    Differential diagnosis includes most likely due to an acute exacerbation of a chronic psychiatric problem given psych history and age. There may additionally be a component of opioid withdrawal; patient is mildly tachycardic, with nausea and diarrhea. She is on Subutex, will continue further in the ED. Further, UA + for infection; will treat with Cefdinir. Patient also hydrated with PO fluids, which resolved her tachycardia. Doubt organic etiology given no somatic complaints aside from above listed. Doubt hyperthyroidism, TSH wnl. No signs of intoxication.       Workup Review:  Pertinent Lab Results (interpreted independently by me):     CBC: no leukocytosis or anemia  CMP: No severe electrolyte derangements or organ dysfunction  TSH, doubt hyperthyroidism  UA + for infection  Beta HCG negative, doubt IUP     The case was discussed with the following service(s) Psychiatry    Brief details of consultant discussion(s): Please see ED Course for further details      Disposition Decision:  [facility] ED MDM Dispo Logic: Patient's ability to obtain treatment and access outpatient care was complicated by substance use, therefore social work consulted, resources provided.  Transfer                                                   The patient's condition of decompensated  schizophrenia, increased AVH will benefit from continued  workup and management. Patient will be transferred to outside hospital. Discussion, if pertinent, listed in the ED Course.               The following work up was performed during the encounter. Orders placed after a disposition has been selected will not be listed here. A complete account of orders should be referenced elsewhere:     ED Orders (From admission, onward)      Ordered     Status Ordering Provider    07/27/22 1213  Remove All Peripheral IV Lines Prior to Discharge  ONE TIME        Comments: For patients that have an accessed Port-a-Cath rather than a peripheral IV, please de-access the Port-a-Cath.    Margrett Rud [facility]    07/27/22 1213  Discharge Patient - Review & Print AVS  ONE TIME         Ordered Juliet Rude [facility]    07/27/22 1213  Do not refer to quitline - Patient declined referral  ONCE         Completed by Nigel Bridgeman on 07/27/2022 at 12:13 PM Juliet Rude HYUN    07/27/22 1213  Do not prescribe smoking cessation medications - Patient refused  ONCE         Completed by Nigel Bridgeman on 07/27/2022 at 12:13 PM KIM, Chrys Racer    07/26/22 2309  C. trachomatis + N. gonorrhoeae by PCR Sterile Container Urine  ONCE         In process PALABOD, [facility] PETER    07/26/22 1422  prazosin (MINIPRESS) capsule 1 mg  2 TIMES DAILY         Last MAR action: Given - by Marylene Land, EMMA AGUILLON on 07/27/22 at 0832 FAROKHPAY, Ignacia Felling    07/26/22 1355  nicotine (NICORETTE) gum 4 mg  EVERY 2 HOURS PRN         Last MAR action: Given - by Marylene Land, EMMA AGUILLON on 07/27/22 at 0808 Kinnie Feil    07/26/22 1158  Nutritional Supplement Deliver Supplements: TID; Boost Plus  DIET EFFECTIVE NOW        Comments: Chocolate flavor please.    Acknowledged FAROKHPAY, REZA    07/26/22 1150  Diet Order Regular; Safety/Custody Tray  DIET EFFECTIVE NOW        Comments: Preference for chocolate milk (regular).  Also, please provide double  portions of vegetables and fruits.  Thanks so much!!    Acknowledged FAROKHPAY, REZA    07/25/22 1547  gabapentin (NEURONTIN) capsule 600 mg  3 TIMES DAILY         Last MAR action: Given - by Vickey Sages on 07/27/22 at 0652 FAROKHPAY, REZA    07/25/22 1536  LORazepam (ATIVAN) tablet 2 mg  EVERY 6 HOURS PRN        Note to Pharmacy: Not to exceed total of 4 mg in 24 hours.    Last MAR action: Given - by Ronda Fairly on 07/27/22 at 1056 FAROKHPAY, REZA    07/25/22 1450  Diet Snack Veggies and hummus for 2 pm snacks and 8 pm snacks.  DIET EFFECTIVE NOW        Comments: Veggies and hummus for 2 pm snacks and 8 pm snacks.    Acknowledged FAROKHPAY, REZA    07/24/22 1411  nicotine (NICODERM CQ) 14 MG/24HR patch 1 patch  DAILY         Last MAR action: Patch Applied - by [employer], EMMA  AGUILLON on 07/27/22 at 0808 [facility] E    07/24/22 2216  diphenhydrAMINE (BENADRYL) tablet or capsule 50 mg  NIGHTLY PRN         Acknowledged CRAWFORD, ANDREW RODNEY    07/24/22 1617  buprenorphine (SUBUTEX) SL tablet 2 mg  2 TIMES DAILY         Last MAR action: Given - by Marylene Land, EMMA AGUILLON on 07/27/22 at 0810 [facility], RACHAEL E    07/24/22 1617  lithium (ESKALITH) capsule 600 mg  NIGHTLY         Last MAR action: Given - by Delton See on 07/26/22 at 2053 [facility], RACHAEL E    07/24/22 1049  nicotine (NICODERM CQ) 7 MG/24HR patch 1 patch  DAILY         Last MAR action: Patch Removed - by Delma Post NODADO on 07/24/22 at 2359 [facility], RACHAEL E    07/23/22 2043  quetiapine (SEROQUEL) tablet 100 mg  2 TIMES DAILY         Last MAR action: Given - by Marylene Land, EMMA AGUILLON on 07/27/22 at 0832 MAYNARD, NICOLE PAIGE    07/24/22 0121  Glucose, Blood Green Plasma Separator Tube  AM LAB DRAW        Comments: Fasting      Final result Martina Sinner [facility] PETER    07/24/22 0121  Lipid Panel Green Plasma Separator Tube  AM LAB DRAW         Final result Martina Sinner [facility] PETER    07/24/22 0121  Thyroid Cascade Green Plasma Separator  Tube  AM LAB DRAW         Edited Result - FINAL Renette Butters PETER    07/24/22 0121  Voluntary but Holdable  ONGOING         Acknowledged PALABOD, SHAHIN PETER    07/24/22 0121  No Additional Isolation - Standard Precautions  ONGOING         Completed by Renette Butters PETER on 07/24/2022 at  1:21 AM PALABOD, [facility] PETER    07/24/22 0121  Low Risk of VTE; Fully Ambulatory  ONGOING        Comments: No VTE prophylaxis needed in this patient.    Anda Latina PETER    07/24/22 0121  MRSA Screen/Culture  ONCE         Final result Martina Sinner [facility] PETER    07/24/22 0121  Full Code / Full resuscitative therapy / full diagnostic & therapeutic care  ONGOING         Acknowledged Renette Butters PETER    07/24/22 0121  Nursing Misc Order: Ensure consent for psychiatric medications is completed prior to giving nonemergency medication.  ONGOING         Acknowledged Renette Butters PETER    07/24/22 0121  IP Activity Therapy Services Evaluation  ONCE         Completed by Renette Butters PETER on 07/24/2022 at  1:21 AM PALABOD, [facility] PETER    07/24/22 0121  Admit to Inpatient Psychiatry  ONE TIME         Completed by Renette Butters PETER on 07/24/2022 at  1:21 AM PALABOD, [facility] PETER    07/24/22 0121  acetaminophen (TYLENOL) tablet 650 mg  EVERY 4 HOURS PRN         Last MAR action: Given - by Delton See on 07/26/22 at 1644 PALABOD, [facility] PETER    07/24/22 0121  polyethylene glycol (MIRALAX) packet 17 g  DAILY PRN  Acknowledged Renette Butters PETER    07/24/22 0121  menthol cough suppressant 1 lozenge  EVERY 4 HOURS PRN         Acknowledged Renette Butters PETER    07/23/22 2353  Admit to Inpatient Psychiatry  ONE TIME         Completed by Jerolyn Center on 07/24/2022 at  1:16 AM PALABOD, [facility] PETER    07/23/22 2353  No Additional Isolation - Standard Precautions  ONGOING         Completed by Renette Butters PETER on 07/23/2022 at 11:54 PM PALABOD, [facility] PETER    07/23/22 2258  cefdinir (OMNICEF)  capsule 300 mg  EVERY 12 HOURS         Last MAR action: Given - by Romero Liner on 07/27/22 at 0808 Arta Silence    07/23/22 2127  Comprehensive Metabolic Panel  ONCE         Edited Result - FINAL Renette Butters PETER    07/23/22 2127  Thyroid Function Panel (Ultrasensitive TSH + Free T4) Green Plasma Separator Tube  ONCE         Final result PALABOD, SHAHIN PETER    07/23/22 2127  CBC w/ Diff Lavender  ONCE         Final result PALABOD, SHAHIN PETER    07/23/22 2108  HCG Quantitative, Blood Green Plasma Separator Tube  ONCE         Final result Ladene Allocca, Cheree Ditto S    07/23/22 2043  QUEtiapine (SEROQUEL) tablet 600 mg  AT BEDTIME         Last MAR action: Given - by Delton See on 07/26/22 at 2053 MAYNARD, NICOLE PAIGE    07/23/22 1818  buprenorphine (SUBUTEX) SL tablet 4 mg  ONCE         Last MAR action: Given - by TURNER, MATT on 07/23/22 at 2023 RIGDON, TYLER    07/23/22 1826  Coronavirus Disease 2019 (COVID-19) SCOVS  ONCE         Edited Result - FINAL Vianne Bulls    07/23/22 1810  IP Consult to Psychiatry  ONE TIME        Provider:  (Not yet assigned)    Completed by [facility], JAESU on 07/23/2022 at 10:46 PM RIGDON, TYLER    07/23/22 1810  IP Consult to Social Work  ONE TIME         Completed by Oda Cogan, AN on 07/23/2022 at 11:56 PM RIGDON, TYLER    07/23/22 1810  Urinalysis  ONCE         Final result RIGDON, TYLER    07/23/22 1810  Drug Screen Rapid Panel 12 No Confirmation, Urine  ONCE         Final result RIGDON, TYLER    07/23/22 1810  Urine HCG  ONCE         Final result RIGDON, TYLER    07/24/22 0121  C. Diff PCR w/Reflex, Stool - On Admission Screen  PRN         Acknowledged Renette Butters PETER    07/24/22 0121  Vital signs  PER NURSING STANDARD OF CARE       Acknowledged Drenda Freeze             ED COURSE/PATIENT REASSESSMENTS:  "ED Course" is listed below that provides real time documentation during ER encounter and will provide further context to the MDM discussion  above.    Workup Summary  Value Comment By Time      Paged psychiatry  Arta Silence, MD 03/14 [phone]      Psychiatry kindly agrees to see the patient.  Arta Silence, MD 03/14 1843     Heart Rate: 62 (Reviewed) Arta Silence, MD 03/14 2029      Pending Placement to Psych Facility: 27 year old female with Schizophrenia on a level 2<br>-Medically stable for psych evaluation: yes<br>-Psych Meds Ordered: no<br>-Required emergency meds: no<br>-Medical Conditions: hypothyroidism<br>  -Chronic Meds Ordered: yes<br>  -Additional Consults: no<br>  -Needs 2nd Medical Evaluation for Stability? no<br><br> Arta Silence, MD 03/14 2319              DIAGNOSIS:    ICD-10-CM ICD-9-CM   1. Auditory hallucination  R44.0 780.1   2. Acute cystitis without hematuria  N30.00 595.0   3. Opioid use  F11.90 305.50                   Arta Silence, MD  Resident  07/24/22 1330    ATTENDING ATTESTATION:  I evaluated the patient concurrently with the Resident/Fellow.  I discussed the case with the Resident/Fellow and agree with the findings and plan as documented by the Resident/Fellow.  Any additions or revisions are included in the record as necessary.    Vianne Bulls, MD          Vianne Bulls, MD  07/27/22 1235

## 2022-07-23 NOTE — ED Provider Notes (Incomplete Revision)
CHIEF COMPLAINT:   Mental Health Problem (Sent from rehab facility, +AVH voices telling pt "kill yourself" constantly after stopping Ativan, -SI/+HI, although reports "not hard to not act on voices"; hx shizo bipolar)     HISTORY OF PRESENT ILLNESS:   Interpreter used: No (English Preferred Language)    Jill Wood is a 27 year old female with PMHx Schizoaffective Bipolar who presents with mental health concern.    Reports increased AVH today, a "narration of everything I do. It's not just in my head. You should just kill yourself".     Recently stopped ativan 12 days ago , symptoms began a week or two ago.     On 400 mg Seroquel BID, propranolol, robaxin, suboxone (for fentanyl, quit about two weeks ago)    Guarded whether SI/HI. Associated nausea, diarrhea non-bloody     From [facility] treatment.     She has been hospitalized for this in the past. +psychiatrist.     Denies chest pain, SOB, vomiting, headache, abdominal pain         {If your patient was unable to give a complete history, use ".UTO" :23999}  {SnapShot  Medical & Surgical History  Chart Review  Results Review :23999}    PAST MEDICAL HISTORY:  Past Medical History:   Diagnosis Date   . Hyperthyroidism    . Panic disorder    . Schizoaffective disorder, bipolar type (CMS-HCC)       Patient Active Problem List    Diagnosis Date Noted   . Schizoaffective disorder (CMS-HCC) 07/24/2022   . Unspecified psychosis not due to a substance or known physiological condition (CMS-HCC) 07/23/2022     SURGICAL HISTORY:  Past Surgical History:   Procedure Laterality Date   . DILATION AND CURETTAGE OF UTERUS          ALLERGIES:  Allergies   Allergen Reactions   . Haldol [Haloperidol] Other     EPS   . Zyprexa [Olanzapine] Other     EPS        FAMILY HISTORY:  Reviewed and considered non-contributory     SOCIAL HISTORY/DETERMINANTS OF HEALTH:  Social History     Socioeconomic History   . Marital status: Single   Tobacco Use   . Smoking status: Never   .  Smokeless tobacco: Current   Substance and Sexual Activity   . Alcohol use: Not Currently     Comment: Last use 18 days ago   . Drug use: Not Currently     Types: Methamphetamines, Opiates (Morphine/Heroin/Oxycodone/Hydrocodone/Fentanyl)     Comment: Last use 18 days ago     Social Determinants of Health     Food Insecurity: No Food Insecurity (07/24/2022)    Hunger Vital Sign    . Worried About Charity fundraiser in the Last Year: Never true    . Ran Out of Food in the Last Year: Never true   Transportation Needs: No Transportation Needs (07/24/2022)    PRAPARE - Transportation    . Lack of Transportation (Medical): No    . Lack of Transportation (Non-Medical): No   Intimate Partner Violence: Low Risk  (07/24/2022)    [place] IPV    . Have you ever been emotionally or physically abused by your partner or someone important to you?: No    . Within the last year have you been hit, slapped, kicked or otherwise physically hurt by someone?: No    . Within the last year has anyone forced  you to have sexual activities?: No    . Are you afraid of your spouse or partner you listed above?: No   Housing Stability: Low Risk  (07/24/2022)    Housing Stability Vital Sign    . Unable to Pay for Housing in the Last Year: No    . Number of Places Lived in the Last Year: 0    . Unstable Housing in the Last Year: No      VITAL SIGNS:  First Vitals [07/23/22 1754]   Temperature Heart Rate Respirations Blood pressure (BP) SpO2   98 F (36.7 C) 115 16 136/83 100 %     PHYSICAL EXAM:  General: Awake, alert, in no apparent distress.  Head: Normocephalic, atraumatic.  Respiratory: Breathing comfortably and in no respiratory distress. No audible wheezing or stridor.   Cardiovascular: Tachycardic, Extremities are warm and well perfused.  Neuro: Awake and alert. Moving extremities.  Skin: - rashes, abscesses, or lacerations  Psych: + SI, + HI, + AVH  Physical Exam   {Please use the dotphrase .EDEXAMLIST to reload the smartlist option.  :23999}    SMART Medical Clearance:   (If all answers are NO, then patient is medically stable for psychiatry evaluation)    Suspect New Onset Psychiatric Condition?: -    Medical Screening:  -Pregnant: -  -Diabetes with Fasting Blood Sugar <60 or >250: -    Abnormal?:  -Vital Signs Unstable? -  -Alert and oriented < 3? -  -Clinically Intoxicated? -    Risky Presentation?:  -Age (<12 or >55): -  -Toxic Ingestion: -  -Eating Disorder: -  -Daily Alcohol Use for at least 2 weeks (high potential for alcohol withdrawal): -  -Ill-Appearing, Significant Injury, Found Down: -    Therapeutic Levels Needed?:  -Phenytoin (Dilantin): -  -Warfarin (Coumadin): -  -Valproic Acid (Depakote): -  -Lithium: -  -Digoxin: -     { If patient is MODERATE risk based on Colp is not available to do a SAFE-T evaluation, use ".SAFETUCI" in your note. :23999}    MEDICAL DECISION MAKING:  {Chart Review  Lab Results  Radiology Report  Scoring Tools  Workup Summary  Disposition Link  Consult Report :23999}  Initial Impressions:  Jill Wood is a 27 year old female who presents with AH/VH.   Vital signs and physical exam were notable for:     VS significant for tachycardia    Differential diagnosis includes most likely due to an acute exacerbation of a chronic psychiatric problem given psych history and age. There may additionally be a component of opioid withdrawal; patient is mildly tachycardic, with nausea and diarrhea. She is on Subutex, will continue further in the ED. Further, UA + for infection; will treat with Cefdinir. Patient also hydrated with PO fluids, which resolved her tachycardia. Doubt organic etiology given no somatic complaints aside from above listed. Doubt hyperthyroidism, TSH wnl. No signs of intoxication.       Workup Review:  Pertinent Lab Results (interpreted independently by me):     CBC: no leukocytosis or anemia  CMP: No severe electrolyte derangements or organ dysfunction  TSH, doubt  hyperthyroidism  UA + for infection  Beta HCG negative, doubt IUP     The case was discussed with the following service(s) Psychiatry    Brief details of consultant discussion(s): Please see ED Course for further details  {Finish your consult order. Consult Report :23999}    Disposition Decision:  [facility] ED MDM Dispo  Logic: Patient's ability to obtain treatment and access outpatient care was complicated by substance use, therefore social work consulted, resources provided.  Transfer                                                   The patient's condition of decompensated schizophrenia, increased AVH will benefit from continued workup and management. Patient will be transferred to outside hospital. Discussion, if pertinent, listed in the ED Course.      {If you need to re-launch the MDM tools use .HELPEDPSYCHMDM :23999}     { This will not display in your note. Reference material for MDM best practices.  Initial Impressions - exam, vitals, initial differential. Pertinent recent history  Workup Review - YOUR interpretation of labs and imaging, chronic conditions considered, EKG  Disposition decision - social determinants of health, why you did or DID NOT do something, using scoring tools. Ultimately "why" you chose the disposition. :23999}    The following work up was performed during the encounter. Orders placed after a disposition has been selected will not be listed here. A complete account of orders should be referenced elsewhere:     ED Orders (From admission, onward)      Ordered     Status Ordering Provider    07/24/22 1411  nicotine (NICODERM CQ) 14 MG/24HR patch 1 patch  DAILY         Acknowledged GROH, RACHAEL E    07/24/22 1617  buprenorphine (SUBUTEX) SL tablet 2 mg  2 TIMES DAILY         Acknowledged GROH, RACHAEL E    07/24/22 1617  lithium (ESKALITH) capsule 600 mg  NIGHTLY         Last MAR action: Given - by LE, MANDY on 07/24/22 at 2027 [facility], RACHAEL E    07/24/22 1651  Diet Order Regular;  Safety/Custody Tray  DIET EFFECTIVE NOW        Comments: Preference for almond milk or chocolate milk (regular)    Acknowledged GROH, RACHAEL E    07/24/22 1617  propranolol (INDERAL) tablet 20 mg  3 TIMES DAILY         Last MAR action: Given - by LE, MANDY on 07/24/22 at 2027 [facility], RACHAEL E    07/24/22 1617  Diet Snack Veggies and hummus  DIET EFFECTIVE NOW        Comments: Veggies and hummus    Acknowledged [place], RACHAEL E    07/24/22 1049  nicotine (NICODERM CQ) 7 MG/24HR patch 1 patch  DAILY         Last MAR action: Patch Applied - by [place], MIN SUK on 07/24/22 at 1215 [facility], [place] E    07/23/22 2043  quetiapine (SEROQUEL) tablet 100 mg  2 TIMES DAILY         Last MAR action: Given - by [facility], MIN SUK on 07/24/22 at [place], NICOLE PAIGE    07/24/22 0121  Glucose, Blood Green Plasma Separator Tube  AM LAB DRAW        Comments: Fasting      Final result PALABOD, SHAHIN PETER    07/24/22 0121  Lipid Panel Green Plasma Separator Tube  AM LAB DRAW         Final result Lenon Ahmadi [facility] PETER    07/24/22 0121  Thyroid Cascade Green Plasma  Separator Tube  AM LAB DRAW         Edited Result - FINAL Althea Grimmer PETER    07/24/22 0121  Voluntary but Holdable  ONGOING         Acknowledged PALABOD, SHAHIN PETER    07/24/22 0121  No Additional Isolation - Standard Precautions  ONGOING         Completed by Althea Grimmer PETER on 07/24/2022 at  1:21 AM PALABOD, [facility] PETER    07/24/22 0121  Low Risk of VTE; Fully Ambulatory  ONGOING        Comments: No VTE prophylaxis needed in this patient.    Gwynneth Munson PETER    07/24/22 0121  MRSA Screen/Culture  ONCE         In process Lenon Ahmadi [facility] PETER    07/24/22 0121  Full Code / Full resuscitative therapy / full diagnostic & therapeutic care  [place], [place]    07/24/22 0121  Nursing Misc Order: Ensure consent for psychiatric medications is completed prior to giving nonemergency medication.  ONGOING         Acknowledged  Althea Grimmer PETER    07/24/22 0121  IP Activity Therapy Services Evaluation  ONCE         Completed by Althea Grimmer PETER on 07/24/2022 at  1:21 AM PALABOD, [facility] PETER    07/24/22 0121  Admit to Inpatient Psychiatry  ONE TIME         Completed by Althea Grimmer PETER on 07/24/2022 at  1:21 AM PALABOD, [facility] PETER    07/24/22 0121  acetaminophen (TYLENOL) tablet 650 mg  EVERY 4 HOURS PRN         Acknowledged PALABOD, SHAHIN PETER    07/24/22 0121  polyethylene glycol (MIRALAX) packet 17 g  DAILY PRN         Acknowledged PALABOD, SHAHIN PETER    07/24/22 0121  menthol cough suppressant 1 lozenge  EVERY 4 HOURS PRN         Acknowledged Althea Grimmer PETER    07/23/22 2353  Admit to Inpatient Psychiatry  ONE TIME         Completed by Lorrin Jackson on 07/24/2022 at  1:16 AM PALABOD, [facility] PETER    07/23/22 2353  No Additional Isolation - Standard Precautions  ONGOING         Completed by Althea Grimmer PETER on 07/23/2022 at 11:54 PM PALABOD, [facility] PETER    07/23/22 2258  cefdinir (OMNICEF) capsule 300 mg  EVERY 12 HOURS         Last MAR action: Given - by LE, MANDY on 07/24/22 at 2027 RIGDON, Dorothea Ogle    07/23/22 2127  Comprehensive Metabolic Panel  ONCE         Edited Result - FINAL Althea Grimmer PETER    07/23/22 2127  Thyroid Function Panel (Ultrasensitive TSH + Free T4) Green Plasma Separator Tube  ONCE         Final result PALABOD, SHAHIN PETER    07/23/22 2127  CBC w/ Diff Lavender  ONCE         Final result PALABOD, SHAHIN PETER    07/23/22 2108  HCG Quantitative, Blood Green Plasma Separator Tube  ONCE         Final result Ardyce Heyer, Phillip Heal S    07/23/22 2043  QUEtiapine (SEROQUEL) tablet 600 mg  AT BEDTIME  Last MAR action: Given - by LE, MANDY on 07/24/22 at 2028 Cathie Olden PAIGE    07/23/22 2043  LORazepam (ATIVAN) tablet 1 mg  EVERY 6 HOURS PRN         Last MAR action: Given - by LE, MANDY on 07/24/22 at [place], NICOLE PAIGE    07/23/22 1953  nicotine (NICORETTE) gum 2 mg  EVERY  2 HOURS PRN         Last MAR action: Given - by LE, MANDY on 07/24/22 at [place], NICOLE PAIGE    07/23/22 1818  buprenorphine (SUBUTEX) SL tablet 4 mg  ONCE         Last MAR action: Given - by TURNER, MATT on 07/23/22 at [place]    07/23/22 1826  Coronavirus Disease 2019 (COVID-19) SCOVS  ONCE         Edited Result - FINAL Rise Mu    07/23/22 1810  IP Consult to Psychiatry  ONE TIME        Provider:  (Not yet assigned)    Completed by [facility], JAESU on 07/23/2022 at 10:46 PM RIGDON, TYLER    07/23/22 1810  IP Consult to Social Work  ONE TIME         Completed by Vergia Alberts, AN on 07/23/2022 at 11:56 PM RIGDON, TYLER    07/23/22 1810  Urinalysis  ONCE         Final result RIGDON, TYLER    07/23/22 1810  Drug Screen Rapid Panel 12 No Confirmation, Urine  ONCE         Final result RIGDON, TYLER    07/23/22 1810  Urine HCG  ONCE         Final result RIGDON, TYLER    07/24/22 0121  C. Diff PCR w/Reflex, Stool - On Admission Screen  PRN         Acknowledged Althea Grimmer PETER    07/24/22 0121  Vital signs  PER NURSING STANDARD OF CARE       Acknowledged Zenovia Jordan             ED COURSE/PATIENT REASSESSMENTS:  "ED Course" is listed below that provides real time documentation during ER encounter and will provide further context to the MDM discussion above.    Workup Summary         Value Comment By Time      Paged psychiatry  Esmond Camper, MD 03/14 [place]      Psychiatry kindly agrees to see the patient.  Esmond Camper, MD 03/14 1843     Heart Rate: 49 (Reviewed) Esmond Camper, MD 03/14 2029      Pending Placement to Psych Facility: 27 year old female with Schizophrenia on a level 2<br>-Medically stable for psych evaluation: yes<br>-Psych Meds Ordered: no<br>-Required emergency meds: no<br>-Medical Conditions: hypothyroidism<br>  -Chronic Meds Ordered: yes<br>  -Additional Consults: no<br>  -Needs 2nd Medical Evaluation for Stability? no<br><br> Esmond Camper, MD 03/14 2319               DIAGNOSIS:{Disposition Link Add multiple impressions from visit and history. :23999}    ICD-10-CM ICD-9-CM   1. Auditory hallucination  R44.0 780.1   2. Acute cystitis without hematuria  N30.00 595.0   3. Opioid use  F11.90 305.50            {If you believe this patient required critical care, please use .CCT Attending must list time  :23999}  {Use .UCIATTEST to pick the correct medical  student/resident attestation. :[id number]     Esmond Camper, MD  Resident  07/24/22 1330

## 2022-07-23 NOTE — ED Notes (Addendum)
Pr Dr Phillip Heal: ok to hold off IVF for now and provide po hydration. PO hydration and warm meal tray provided to patient at this time. Patient eating meal tray in bed while talking to medical student at bedside. Patient appears calm at this time.

## 2022-07-23 NOTE — Consults (Signed)
Please see complete consult note by Cathie Olden MD on 07/23/22.
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
## 2022-07-23 NOTE — ED Notes (Signed)
Psych medical student at bedside talking to patient.

## 2022-07-24 DIAGNOSIS — F259 Schizoaffective disorder, unspecified: Secondary | ICD-10-CM | POA: Diagnosis present

## 2022-07-24 DIAGNOSIS — F250 Schizoaffective disorder, bipolar type: Secondary | ICD-10-CM

## 2022-07-24 LAB — LIPID(CHOL FRACT) PANEL, BLOOD
Cholesterol: 161 MG/DL (ref <200)
HDL Cholesterol: 60 MG/DL (ref >40)
LDL Cholesterol (calc): 67 MG/DL (ref <160)
Non HDL Cholesterol (calculated): 101 MG/DL (ref <130)
Triglycerides: 169 MG/DL — ABNORMAL HIGH (ref <150)
VLDL Cholesterol (calculated): 34 MG/DL

## 2022-07-24 LAB — GLUCOSE, BLOOD: Glucose: 73 mg/dL (ref 70–115)

## 2022-07-24 LAB — THYROID CASCADE: TSH, Ultrasensitive: 1.938 u[IU]/mL (ref 0.450–4.120)

## 2022-07-24 MED ORDER — POLYETHYLENE GLYCOL 3350 OR PACK
17.0000 g | PACK | Freq: Every day | ORAL | Status: DC | PRN
Start: 2022-07-24 — End: 2022-07-27

## 2022-07-24 MED ORDER — MENTHOL MT LZG (WRAPPER)
1.0000 | LOZENGE | OROMUCOSAL | Status: DC | PRN
Start: 2022-07-24 — End: 2022-07-27

## 2022-07-24 MED ORDER — PROPRANOLOL HCL 10 MG OR TABS
20.0000 mg | ORAL_TABLET | Freq: Three times a day (TID) | ORAL | Status: DC
Start: 2022-07-24 — End: 2022-07-26
  Administered 2022-07-24 – 2022-07-26 (×5): 20 mg via ORAL
  Filled 2022-07-24 (×7): qty 2

## 2022-07-24 MED ORDER — DIPHENHYDRAMINE HCL 25 MG OR TABS OR CAPS
50.0000 mg | ORAL_CAPSULE | Freq: Every evening | ORAL | Status: DC | PRN
Start: 2022-07-24 — End: 2022-07-27

## 2022-07-24 MED ORDER — BUPRENORPHINE HCL 2 MG SL SUBL
2.0000 mg | SUBLINGUAL_TABLET | Freq: Two times a day (BID) | SUBLINGUAL | Status: DC
Start: 2022-07-24 — End: 2022-07-27
  Administered 2022-07-24 – 2022-07-27 (×6): 2 mg via SUBLINGUAL
  Filled 2022-07-24 (×5): qty 1

## 2022-07-24 MED ORDER — ACETAMINOPHEN 325 MG PO TABS
650.0000 mg | ORAL_TABLET | ORAL | Status: DC | PRN
Start: 2022-07-24 — End: 2022-07-27
  Administered 2022-07-26: 650 mg via ORAL
  Filled 2022-07-24: qty 2

## 2022-07-24 MED ORDER — NICOTINE 7 MG/24HR TD PT24
1.0000 | MEDICATED_PATCH | Freq: Every day | TRANSDERMAL | Status: AC
Start: 2022-07-24 — End: 2022-07-24
  Administered 2022-07-24: 1 via TRANSDERMAL
  Filled 2022-07-24: qty 1

## 2022-07-24 MED ORDER — NICOTINE 14 MG/24HR TD PT24
1.0000 | MEDICATED_PATCH | Freq: Every day | TRANSDERMAL | Status: DC
Start: 2022-07-25 — End: 2022-07-27
  Administered 2022-07-25 – 2022-07-27 (×3): 1 via TRANSDERMAL
  Filled 2022-07-24 (×3): qty 1

## 2022-07-24 MED ORDER — LITHIUM CARBONATE 300 MG OR CAPS
600.0000 mg | ORAL_CAPSULE | Freq: Every evening | ORAL | Status: DC
Start: 2022-07-24 — End: 2022-07-27
  Administered 2022-07-24 – 2022-07-26 (×3): 600 mg via ORAL
  Filled 2022-07-24 (×3): qty 2

## 2022-07-24 NOTE — Plan of Care (Signed)
Problem: [facility]  Goal: Patient will demonstrate behavior improvement and be able to function outside hospital by discharge or behavior will be stable for transfer to outside facility.  Flowsheets (Taken 07/24/2022 0247)  Population/Condition Focus:   Mood/Thought Disorder   Schizophrenia/Psychosis  Individualized Short Term Goal by End of Shift: pt will engage in appropriate staff/peer conversation during admission  Individualized Interventions: pt encouraged to engage in appropriate staff/peer conversation during admission    Admission note: Pt admitted from ED. Upon admission, VS taken, skin checked with two licensed staff, no lice found, calm and cooperative during admission. Remains free from fall or injury during admission. Denies SI/HI/AH/VH, upon admission, unit orientation given to patient, verbalized understanding, patient rights package given upon admission, pt given instruction to call for help when in need, verbalized understanding. Will continue to monitor.      Risk Factors:  Suicide Risk: No evidence of thoughts or plans  Self-Harm Risk: No evidence of thoughts or plans  Assault Risk: No evidence of assault/aggressive behavior or plan  Hypersexual Risk: No risk  AWOL Risk: No evidence of thought or plan      Patient/Family Stated Goal:  Patient/Family Stated Goal: " to get better"    Triggers & Coping Mechanisms:  Triggers: Someone cursing;Someone calling you names;People ignoring you;Yelling  Coping Mechanisms: Thinking of something pleasant;Medications;Relaxation exercises;Reading;Having a snack    Outcome Evaluation: Progressing    Rationale: pt engaged in appropriate staff/peer conversation during admission    Recommendations: continue to engage in appropriate staff/peer conversation during admission      Restraints (Document known precipitating event(s), nursing intervention(s), patient response to intervention(s) in narrative note.): n/a

## 2022-07-24 NOTE — ED Notes (Signed)
Report called to Otila Kluver, RN on 2 [place] at this time
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
## 2022-07-24 NOTE — Interdisciplinary (Signed)
07/24/22 1044   Behavioral Reasons for Admission   Behavioral Reasons for Admission Auditory hallucinations;Visual hallucinations;Changes in sleep pattern;Paranoia;Delusions;Mania   Cognition   Insight Fair   Judgment/Safety Awareness Fair   Attention Span/Concentration/Focus Good   Problem Solving Good   Memory/Orientation Good   Goal Setting and Implementation Fair   Socialization/Emotional Skills Fair   Communication Skills Fair   Barriers to Social Functioning OSH Severity of psychiatry symptoms   Living Skills   Living Arrangements Other (comments)  (pt. previously at rehab - 20 days sober. Pt. hopes to return to rehab upon D/C and possibly transfer to rehab in [place] - closer to pt.'s family)   Transportation Independent   Physical Mobility Independent   Strengths/Weaknesses/Stressors   Strengths Has housing;Has income;Medication/treatment compliant;Motivated for treatment;Seeking treatment voluntarily;Access to community resources;Ability to make needs known;Other (comments)  (connected to OP providers; in rehab)   Weaknesses Comorbid conditions;Long psychiatric history;Poor social support;Multiple hospitalizations;Substance abuse  (pt. currently 20 days sober)   Stressors Recent loss of functioning  ("the voices, getting off of suboxone as the rehab in [place] won't let me take it.")   Triggers/Coping Skills   Triggers pt. stated "nope."   Preferred Coping Skills "go upstairs, get under the covers, listen to my favorite music, go to meeting (NA/AA/CMA), and writing."   Preferred Leisure/Activities   Creative/Expressive Arts and Crafts;Writing/Journaling;Listening to music;Dance;Singing;Acting/Theater   Mental/Intellectual Reading;Puzzles/Crosswords;Board games/Card games;TV/Movies;Working on Museum/gallery curator for walks;Swimming;Hiking;Yoga/Tai chi/Pilates   Spiritual/Social/Community Church;Socializing with friends;Socializing with family;Support groups (e.g., AA, NA);Social media  (e.g. TikTok, Instagram);Dining Out;Meditation   Educational psychologist Seeks socialization with peers;Seeks socialization with staff;Responds to others/takes initiative to socialize   Verbal Communication   Verbal Communication WFL (Within Functional Limits)   Sensory Impairment (Observations)   Vision WFL (Within Functional Limits)   Hearing WFL (Within Functional Limits)   Behavioral/Psychiatric Symptoms   Affect Appropriate/Congruent   Mood Hopeful   Thought Content/Delusions No delusions  (per report, pt. has persecatory and bizarre delusions)   Thought Processes Logical/Goal-oriented/Organized   Perception Auditory hallucinations   Speech Appropriate (normal rate, rhythm, tone)   Follows Directions Independent   Behavior/Interpersonal Appropriate/Pleasant   Therapy Goals/Interventions   Goals Maintains appropriate boundaries during group;Patient focuses 15-30 minutes on task without requiring redirection;Participate appropriately in group;Attends 1-3 groups/day;Engages in appropriate conversation during group  (verbalize 4 adaptive coping skills)   Interventions Educate on appropriate communication skills;Educate regarding symptom management;Educate on available community resources;Encourage participation in groups;Offer structured group to increase attention/concentration;Provide education and opportunities to improve impulse control;Provide education and opportunities to improve self-esteem/self-worth;Provide reality reorientation;Provide resources for independent leisure, study, reference;Support medication awareness and management;Provide therapeutic environment;Redirect patient to task at hand;Provide leisure education;Provide limits and boundaries;Provide counseling and support;Offer structured group to increase appropriate social interaction and communication;Encourage verbalization of thoughts and feelings;Educate and provide activities to improve anger management;Educate on positive coping  skills;Educate on stress management     Pt. Stated goal is "to take it one day at a time, get my medication right so that the voices stop or go away." Pt. Educated on milieu activities and encouraged continued group participation.

## 2022-07-24 NOTE — Interdisciplinary (Signed)
07/24/22 [place] 72 Hours of Admission   Reason for No Family Meeting  Family Unavailable/Unreachable   Family Unavailable/Unreachable Details Calls to patient's mother were attempted but mother stated she was unavailablte to provide collateral at this time. Confidential VM left and provided number to nurses' station.   Family Meeting Conducted Over the Phone   Goal of Meeting To gather collateral information, discuss d/c planning and provide support and discuss treatment/aftercare plan   FAMILY ATTENDEES "Cheri" Chandler Team Attendees Angelene Giovanni, MSW Intern   DISCUSSIONS N/A. Tx team to continue to engage with patient plan for safe discharge planning.   OUTCOME Patient to remain hospitalized for further stabilization and safe discharge planning.   Family Meeting 72 Hours of Admission Complete Complete       Angelene Giovanni, MSW Intern

## 2022-07-24 NOTE — Interdisciplinary (Addendum)
13:20 LCSW placed call to clinical director Clarene Essex with [facility] direct: [phone] but no answer.  LCSW left detailed call back message on confidential VM. Purpose of the call is to discuss potential discharge back to their facility tomorrow.     14:30 LCSW placed call again to clinical director Clarene Essex with [facility] direct: [phone] and call goes straight to VM.   LCSW placed call to [facility] main office line: [phone] spoke to receptionist Merrilee Seashore who states Jenny Reichmann is unavailable, they will relay message to have them call this writer back.     15:00 LCSW received call back from clinical director Jenny Reichmann, who expressed concerns for patient discharging from the unit tomorrow. He expressed high concerns for safety due to statements patient has made yesterday (prior to admission) and would like patient to be given the opportunity to start lithium and lab draws as she has been wanting to do.In regards to appts patient has for next week, he states all can be pushed out ot accommodate pt's need for psych hospitalization and stabilization at this time. He is also requesting a "physicians medical clearance" indicating no SI/HI and safe to DC the unit.  He requested for patient to remain hospitalized through the weekend, and check in Monday morning for possible DC. LCSW confirmed to relay update to MDs as requested.        Pager#[phone]

## 2022-07-24 NOTE — Plan of Care (Signed)
Problem: [facility]  Goal: Patient will demonstrate behavior improvement and be able to function outside hospital by discharge or behavior will be stable for transfer to outside facility.  Flowsheets (Taken 07/24/2022 [phone])  Population/Condition Focus:   Mood/Thought Disorder   Schizophrenia/Psychosis  Short Term Goal by End of Shift: Actively participate in one group therapy session  Individualized Short Term Goal by End of Shift: Patient will attend to at least one group this shift.  Individualized Interventions: Encourage patient to attend to groups.     Nursing Note: Patient visible on unit. Interacting with others. Patient denies any SI/HI but verbalizes hearing voices that are commanding and stating "negative things." Patient attended to groups. Patient was med compliant. Patient was given Nicorette gum 2 mg PO PRN at 0909 and 1230 for smoking urge. Ativan 1 mg PO PRN was given at 1230 for anxiety. Patient ate 60% of breakfast and 100% of lunch. LBM 07/23/2022.     Outcome Evaluation: Progressing    Rationale: See above.    Recommendations: Continue to encourage patient to attend to groups.       Risk Factors:  Suicide Risk: No evidence of thoughts or plans  Self-Harm Risk: No evidence of thoughts or plans  Assault Risk: No evidence of assault/aggressive behavior or plan  Hypersexual Risk: No risk  AWOL Risk: No evidence of thought or plan      Patient/Family Stated Goal:  Patient/Family Stated Goal: "to make phone calls"    Restraints: NA

## 2022-07-24 NOTE — Plan of Care (Signed)
Problem: [facility]  Goal: Patient will demonstrate behavior improvement and be able to function outside hospital by discharge or behavior will be stable for transfer to outside facility.  Flowsheets (Taken 07/24/2022 1806)  Population/Condition Focus: Mood/Thought Disorder  Short Term Goal by End of Shift: Verbalize understanding of medication regimen  Individualized Interventions:   Educate patient indication and side effects of medications   Provide medication education sheets when requested   Educate patient to report to nurse and/or doctor when having side effects   Educate patient importance of medication compliance during and after hospitalization   Risk Factors:  Suicide Risk: No evidence of thoughts or plans  Self-Harm Risk: No evidence of thoughts or plans  Assault Risk: No evidence of assault/aggressive behavior or plan  Hypersexual Risk: No risk  AWOL Risk: No evidence of thought or plan    Patient/Family Stated Goal:  Patient/Family Stated Goal: "call my family"    Triggers & Coping Mechanisms:  Triggers: Someone cursing;Someone calling you names;People ignoring you;Yelling  Coping Mechanisms: Thinking of something pleasant;Medications;Relaxation exercises;Reading;Having a snack    Outcome Evaluation: Progressing    Rationale: Pt is complaint with her scheduled medication.     Recommendations: Follow plan of care.     Restraints (Document known precipitating event(s), nursing intervention(s), patient response to intervention(s) in narrative note.): not applicable     Note: Patient is visible on the milieu; she presents restless, hyper verbal and preoccupied. Pt is constantly at the nurse station asking to use the phone; requiring some redirection. Multiple PRN were given throughout the shift; Ativan 1 mg and Nicotine gum was given at 1851, meds were effective. Adherent with medications and denies side effects. She currently denies SI/SH/AI/AVH. Remains on q15 mins safety checks.

## 2022-07-24 NOTE — Interdisciplinary (Signed)
07/24/22 1200   Assessment   Assessment Type Initial   Substance Abuse History within the past 12 months    Substance Used Tobacco/Nicotine;Alcohol;THC: marijuana (flower, wax, vape, edible);Opiates: heroin, fentanyl, morphine, percodan, vicodin;Amphetamines: crystal, cocaine, ecstasy, meth;Intravenous drug use of any kind   Tobacco/Nicotine   Age of First Use 16   Amount daily use   Last Use 07/23/22   Alcohol   Age of First Use 15   Amount daily use of 1/2 of 1/5 of bottle of vodka   Last Use 07/03/22   THC: Marijuana (flower, wax, vape, edible)   Age of First Use 15   Amount daily use of vape, edibles, and flower   Last Use 07/03/22   Opiates: Heroin, Morphine, Percodan, Vicodin, Fentanyl   Drug Abused fentanyl, heroin, pills   Age of First Use 21   Amount daily use   Last Use 07/03/22   Amphetamines: Crystal, Cocaine, Ecstasy, Meth   Drug Abused meth   Age of First Use 19   Amount daily use   Last Use 07/03/22   Intravenous Drug Use of Any Kind   Drug Abused meth and opiates   Age of First Use 19   Amount one time er month   Last Use 06/11/22   Impact of Use   Tolerance to alcohol or drugs yes   Risky behavior while using alcohol/drugs yes   Have you experienced any negative health consequences from using substances? Yes   Have you experienced any negative legal consequences from using substances? Yes   Abstinence Treatment   Attempts to decrease or quit Use Yes   Longest length of time without substance use 4 months   Do you feel like your substance use is a problem? Yes   Do you feel like you need treatment? Yes   What are the barriers which prevent you from abstaining from substance use stress and cravings   Identified support system (list people) family   [facility] previous admits to psych setting   Depression, Anxiety, or Other Symptoms Preceding Use yes   Did Using Substances Make These Symptoms Better or Worse?  better   Strengths/Weaknesses/Stressors    Strengths Has housing;Has income;Motivated for treatment;Access to community resources;Ability to make needs known   Weaknesses Comorbid conditions;Long psychiatric history;Poor social support;Self harm;Multiple hospitalizations;Lacks effective coping skills;Substance abuse;Non-compliance with medications/treatment   Stressors Family discord;Legal trouble;Recent traumatic event;Recent loss of functioning;Other (comments)   Evaluation   Addiction Summary poly-substance use dx, severe, high risk for relapse   Medical Summary hyperthroidism   Psychological Summary oriented Xs 4   Personal Summary HS grad, college,unemployed, homeless but currently in treatment   Family Summary maternal hx of MH and paternal hx of addiction   Legal Summary curretnly has warrants for substance related issues   Stage of Change action   Plan of Care/Interventions   Plan of Care Patient educated on disease process;Patient educated on consequences of continued use   Goals Patient to adopt a 12-step world view;Patient to establish a relapse prevention plan;Patient to build a sober support system   Intervention Patient encouraged to engage in plan of recovery;Patient encouraged to attend groups;Patient was given [employer] which includes [place] [facility];Patient is able to describe danger situations which might result in smoking or relapse while in hospital or at home;Patient explains a plan for coping with the desire to smoke;Smoking cessation education material was given;Reviewed home use of  nicotine replacement therapy, options, administration, and side effects;Received feedback concerning the quality and frequency of alcohol consumed in comparison with national norms;Informed of negative physical, emotional, and occupational consequences of alcohol consumption;Reviewed overall severity of the problem with patient;Patient was given information on addiction and recovery process including resources for sobriety  support after discharge;Patient was given education on relapses and interventions;Patient encouraged to avoid substance abuse   Outcome Evaluation Patient needs reinforcement on disease process;Patient needs reinforcement on recovery process   Discharge Instructions   Substance Abuse Referral Your next level of care provider will manage your addictions treatment   Substance Abuse/Dependence Referral Residential substance abuse treatment facility;12-step meetings (e.g., AA, NA);1-800-NO-BUTTS (Tobacco Cessation)   Patient's Response to Referral Accepted

## 2022-07-24 NOTE — Event / Update (Signed)
COLLATERAL UPDATE:  Called Interior and spatial designer of [facility] Jenny Reichmann O'Donnel [phone]) for collateral about pt's current medication regimen and events leading up to hospitalization. He reports that she was endorsing voices that told her to kill herself and others "700x per day". She was also exhibiting increased energy, sleeplessness, grandiosity, and paranoid delusions about staff and other clients at the residential.     Reports that she has been on the following medication regimen:  - Benzodiazepines taper completed two weeks ago  - Propranolol 20mg  BID for tachycardia  - Suboxone taper as follows:   Thurs 3/14 -   4mg  AM, 4mg  PM   Fri 3/15 -   4mg  AM, 2mg  PM   Saturday 3/16 -  2mg  AM, 2mg  PM   Sunday 3/17 -   2mg  AM, 2mg  PM  - Previous trials of gabapentin, doxepin, and Invega        PLAN UPDATE:  - Nicotine Patch 14mg /24hr  - Consolidate lithium to 600mg  nightly  - Continue Seroquel 100mg  / 100mg  / 600mg   - Continue home propranolol 20mg  TID for tachycardia  - Continue planned suboxone taper with Subutex 2mg  BID  - Continue Ativan 1mg  q6hr PRN for anxiety  - Offered gabapentin and hydroxyzine for anxiety, pt declined    Matty Deamer E. Noralee Stain, MD  Resident Physician  07/24/2022

## 2022-07-24 NOTE — Interdisciplinary (Signed)
Pt has been asleep throughout the night with minimal interruptions to sleep. Respirations were even and unlabored. No signs and symptoms of distress noted during the shift. Will continue to monitor pt and will endorse any issues onto the next shift.

## 2022-07-25 LAB — MRSA CULTURE

## 2022-07-25 MED ORDER — GABAPENTIN 300 MG OR CAPS
900.0000 mg | ORAL_CAPSULE | Freq: Three times a day (TID) | ORAL | Status: DC
Start: 2022-07-25 — End: 2022-07-25

## 2022-07-25 MED ORDER — LORAZEPAM 2 MG OR TABS
2.0000 mg | ORAL_TABLET | Freq: Four times a day (QID) | ORAL | Status: DC | PRN
Start: 2022-07-25 — End: 2022-07-27
  Administered 2022-07-25 – 2022-07-27 (×5): 2 mg via ORAL
  Filled 2022-07-25 (×6): qty 1

## 2022-07-25 MED ORDER — GABAPENTIN 300 MG OR CAPS
600.0000 mg | ORAL_CAPSULE | Freq: Three times a day (TID) | ORAL | Status: DC
Start: 2022-07-25 — End: 2022-07-27
  Administered 2022-07-25 – 2022-07-27 (×7): 600 mg via ORAL
  Filled 2022-07-25 (×7): qty 2

## 2022-07-25 NOTE — Progress Notes (Signed)
Subjective  Nursing report-no acute events overnight.  PRNs-Ativan total of 2 mg, nicotine x3.  Blood pressure 83/55 and heart rate recorded as 80.  On individual interview patient willing to meet at the side conference room.  From the outset discussing that she really wants to be out of the hospital under really like to return to her program.  Advised her that treatment team feels the same however working in coordination with her [facility] as they were concerned about her safety and her symptomatology that she was experiencing prior to coming here.  Advised her that [facility] stated that there was no pressing timeframe in terms of having to come back to facilitate a transfer to [place].  Despite that patient is still very pressing in insistent upon feeling anxious about wanting to leave the hospital.  Advised her again the important things to leave in a safe fashion make sure she is stable in that regard.  Patient declined any acute concerns related to treatment team or staff or patients on the unit.  Feels that she is being treated respectfully overall.    Symptoms:  Patient reporting that she feels like she is going through withdrawals from a body ache standpoint and overall general sense of anxiety.  Patient wanted discussed the fact that she was abruptly started on Suboxone at her outside program with the intention that the program in [place] was able to continue it but realize that this was not the case and so would like to really be off of this medication because it is harder for her to be on it and then come off of it.  Advised her that for now we are going to continue 2 mg b.i.d. as not to disrupt the plan from [place].  Patient declined any concerns related to sleep, energy, appetite.  Did report last night experiencing visual hallucination described as the following-"I saw like the window sill because it makes like a cross design and I saw blood pouring down it.  I also saw  dolls under my blanket and it was really weird. "  Patient seemingly distressed when discussing this.  Today declines experiencing any auditory hallucinations or command auditory hallucinations.  Overall describing a general sense of anxiety about wanting to get out of the hospital, returning back to [place], and also generalized sense of anxiety secondary to feelings of withdrawal.      Medications:  Led into discussion whereby patient really wanted to discuss modifying her Ativan regimen.  Stated that she is experiencing a sense of withdrawal and would not like to lean on the Subutex at higher dosages to treat it because it is not helpful.  Patient speaking at length and pressing for wanting different dosage of Ativan.  Spoke in different forms about options of increasing the dosage versus limiting the frequency.  Ultimately agreed to do Ativan 2 mg q.6 hours but not to exceed 4 mg.  Explained to patient very specifically indirectly that she would not be prescribed this medication upon discharge.  Patient stated that coming off of it after a couple of days is not difficult for her and there is no concern for withdrawal but that she is feeling very anxious being here with having to wait, the time frame, and anxiety about the next step is still in limbo.  Patient very persistent this regard however felt that increasing the Ativan would be appropriate.  Advised her that no other changes would be made to the Ativan in  this regard.  Also brought up the idea of gabapentin.  Initially patient stating that it is not helpful however towards the latter part of the conversation actually requested again herself if gabapentin could be prescribed as an augmentation for her muscular pain secondary to withdrawal.  Discussed specific dosages and she stated she has been on up to 900 mg t.i.d. in the past and stated that we would start this dosage.    -propranolol:  Declines lightheadedness or dizziness and starting it   -lithium:   Declines nausea, vomiting, diarrhea.  Clarified the dosage as she was asking for possibly a higher dosage.  Advised her that 600 mg is the starting dose and then a level will be obtained shortly thereafter.       Mental Status Examination:  Appearance: Patient appears their stated age, has fair grooming and hygiene, and is wearing hospital attire.  Normal muscle tone.  Behavior: no psychomotor agitation or retardation, good eye contact, and calm  Speech:   normal rate  Mood: "I'm so anxious and having muscle pains"   Affect: congruent to stated mood  Thought processes:  coherent, logical, and linear  Thought content:   -  Suicidal and homicidal ideation: no suicidal ideation         -  Abnormal or psychotic thoughts: no evidence of abnormal perceptual disturbances  Insight: Adequate  Judgment: Impaired - minimal  Attention and Concentration: not distracted during conversation.  Gait:  not assessed    Laboratory Studies:   No results found for this or any previous visit (from the past 24 hour(s)).      Assessment and Plan:  Jill Wood is a 27 year old female with history of schizoaffective disorder, bipolar type and methamphetamine use disorder who presents to ED brought in by self from rehab center voluntarily for worsening AVH and mania. UDS negative.      Pt presents with worsening AVH, paranoid ideation, and anxiety in the setting of recent discontinuation of Ativan and new sobriety. Pt also reports recent decreased need for sleep, increased risky behavior, and dressing up inappropriately. MSE notable for psychomotor activation and rapid speech. Patient's history appears to be characterized by chronic AVH and paranoia with discrete episodes of manic symptoms. Pt denies current SI or HI. Pt's presentation is most consistent with exacerbation of underlying schizoaffective disorder, bipolar type. Pt is currently GD and would benefit from inpatient psychiatric hospitalization for stabilization, medication  management, and optimization.     DSM 5 Diagnoses:  Schizoaffective Disorder, Bipolar Type* - F25.0    Active Hospital Problems    Diagnosis    *Unspecified psychosis not due to a substance or known physiological condition (CMS-HCC) [F29]    Schizoaffective disorder (CMS-HCC) [F25.9]      Resolved Hospital Problems   No resolved problems to display.       Plan:  1. Psychiatric Medication Management:  -- continue Seroquel 100mg /100mg /600mg   -- continue Lithium 600 nightly for mood stabilization    Per medication regimen at [facility]  -continue propranolol 20 t.i.d.    -start gabapentin 900 mg t.i.d. for pain and generalized anxiety    PRNs:  -- we will change Ativan from 1 mg q.6 p.r.n. to 2 mg q.6 p.r.n. specifically not to exceed a total of 4 mg in 24 hours for generalized anxiety and patient reporting withdrawal feelings.  Advised patient that will not be prescribed this upon discharge.  Patient aware.  -- Nicotine gum PRN  2. Medical Issues:   # None acute    3. Psychological/Behavioral/Social:   -- Continue individual, group, milieu, and allied therapy services    4. Legals:   Legal Status/Legal Hold   Procedures    Voluntary but Holdable     -- Precautions: None  5. Disposition:  Back to [facility] in the next 1-3 days.

## 2022-07-25 NOTE — Plan of Care (Signed)
Problem:  [facility]  Goal: Patient will demonstrate behavior improvement and be able to function outside hospital by discharge or behavior will be stable for transfer to outside facility.  Flowsheets (Taken 07/25/2022 0814)  Population/Condition Focus:   Schizophrenia/Psychosis   Chemical Dependence and Withdrawal  Short Term Goal by End of Shift: Verbalize coping skill to address emotional trigger  Individualized Interventions:   Educate patient about relaxation techniques such as: aromatherapy   deep breathing exercises   stretching exercises   going outside to breathe fresh air   listening to calming music   doing puzzles   encourage to verbalize feelings and concerns.      1115: Patient's 0900 medications were given late as patient was asleep. She denies feeling sad or depressed but she endorses anxiety. Ativan was given. She was educated about positive coping skills, she stated that she will attend groups to keep herself distracted and she also likes to do some coloring. She  c/o generalized body pain- refused Tylenol. She denies SI/HI/VH. She endorses auditory hallucination but is not able to elaborate its content. She was encouraged to attend milieu activities.     1301: Patient is at the nursing constantly asking for PRN. Aromatherapy provided.     Risk Factors:  Suicide Risk: No evidence of thoughts or plans  Self-Harm Risk: No evidence of thoughts or plans  Assault Risk: No evidence of assault/aggressive behavior or plan  Hypersexual Risk: No risk  AWOL Risk: No evidence of thought or plan      Patient/Family Stated Goal:  Patient/Family Stated Goal: none stated    Triggers & Coping Mechanisms:  Triggers: Someone cursing;Someone calling you names;People ignoring you;Yelling  Coping Mechanisms: Thinking of something pleasant;Medications;Relaxation exercises;Reading;Having a snack    Outcome Evaluation: Progressing    Rationale: Was able to verbalize ways to deal with anxiety and  frustrations.     Recommendations: Provide positive feedback; continue to educate about positive coping skills.       Restraints (Document known precipitating event(s), nursing intervention(s), patient response to intervention(s) in narrative note.):    N/A

## 2022-07-25 NOTE — Interdisciplinary (Signed)
0610 Received patient  at the beginning of the shift sleeping in  bed with eyes close, breathing is unlabored , repositioned for body comfort , no PRN meds given Q47min check done for safety.

## 2022-07-25 NOTE — Plan of Care (Signed)
Problem: [facility]  Goal: Patient will demonstrate behavior improvement and be able to function outside hospital by discharge or behavior will be stable for transfer to outside facility.  Flowsheets (Taken 07/25/2022 1639)  Population/Condition Focus:   Schizophrenia/Psychosis   Chemical Dependence and Withdrawal  Short Term Goal by End of Shift: Verbalize understanding of medication regimen  Individualized Short Term Goal by End of Shift: Patient will verbalize understanding of medication regimen including utilizing alternative methods for anxiety  Individualized Short Term Goal by End of Shift: Educate pt on importance of adhering to treatment regimen  Individualized Interventions: Allow pt to verbalize thoughts and feelings to staff as they arise  Individualized Interventions: Promote pt to perform ADLs and tasks independently.    Risk Factors:  Suicide Risk: No evidence of thoughts or plans  Self-Harm Risk: No evidence of thoughts or plans  Assault Risk: No evidence of assault/aggressive behavior or plan  Hypersexual Risk: No risk  AWOL Risk: No evidence of thought or plan    Patient/Family Stated Goal:  Patient/Family Stated Goal: "Get my Ativan and more food"    Triggers & Coping Mechanisms:  Triggers: Someone cursing;Someone calling you names;People ignoring you;Yelling  Coping Mechanisms: Thinking of something pleasant;Medications;Relaxation exercises;Reading;Having a snack    Outcome Evaluation: Progressing    Rationale: Pt visible on unit and adheres to scheduled medications     Recommendations: Follow plan of care. Q15 min rounds in place     Restraints (Document known precipitating event(s), nursing intervention(s), patient response to intervention(s) in narrative note.): N/A     Nursing Note:   Received pt awake in milieu. Pt presents as friendly with good eye contact. Pt is demanding of medications and can be easily agitated when not able to receive medications early. Pt additionally has  been focused on wanting more snacks and food and observed to be asking peers for food off their trays. Pts behavior is redirectable however pt remains to be mildly irritable. Pt denies SI/HI/AH/VH however endorses to anxiety due to "still withdrawing". Pt has been visible on unit and interacts well with peers, no behavioral issues observed. Pt was compliant with scheduled medications. Will continue to follow plan of care.

## 2022-07-26 LAB — MRSA CULTURE

## 2022-07-26 MED ORDER — PRAZOSIN HCL 1 MG OR CAPS
1.0000 mg | ORAL_CAPSULE | Freq: Two times a day (BID) | ORAL | Status: DC
Start: 2022-07-26 — End: 2022-07-27
  Administered 2022-07-26 – 2022-07-27 (×2): 1 mg via ORAL
  Filled 2022-07-26 (×5): qty 1

## 2022-07-26 MED ORDER — NICOTINE POLACRILEX 2 MG MT GUM
4.0000 mg | CHEWING_GUM | OROMUCOSAL | Status: DC | PRN
Start: 2022-07-26 — End: 2022-07-27
  Administered 2022-07-26 – 2022-07-27 (×6): 4 mg via ORAL
  Filled 2022-07-26 (×6): qty 2

## 2022-07-26 NOTE — Event / Update (Signed)
MD at the bedside, re-evaluating the patient for SI thoughts and hallucinaations

## 2022-07-26 NOTE — Event / Update (Signed)
Patient c/o having thoughts of suicidal with no plan, notified OD. BP 104/63 HR 104, RR 18. Q15 safety monitoring protocol observed

## 2022-07-26 NOTE — Interdisciplinary (Signed)
0615 Received patient at the beginning of the shift sleeping in bed with eyes closed, breathing is unlabored , repositioned self for body comfort , no PRN meds given , AM dose Neurontin , Seroqquel and Inderal as ordered ,Q48min check done for safety.

## 2022-07-26 NOTE — Progress Notes (Signed)
Subjective  Nursing report-no acute events overnight.  No behavioral disturbances.  Sleep-5.75 hours.  PRNs-Ativan 2 mg x 1, 1 mg x 1, nicotine x4.  On individual interview today patient initially noted the hand sanitizer brand that I was using discussed this for a few minutes.  Stated that she spoke with her mom which was helpful.  Overall today stating that "I feel groggy I do not feel good. "  Asked about various physical symptomatology and she stated vague muscle pain and fatigue.  No cough, fevers, shortness of breath or chest pain.  Advised if she felt a COVID test was indicated and she stated "no. "  Stated that she has been hearing voices this morning that were somewhat distressing.  Patient stated that "I notice it when the Ativan wears off. "  Asked patient if there is anything that she is taken in the past that has been effective for voices and she mentioned the quetiapine.  Patient asked if she could be on more than the current dosage advised her that she is currently on the maximum dosage.  Brought up the idea of potentially other antipsychotics however patient stated "0 no because that EPS comes in. "  Overall that advised her that would not introduce another antipsychotic as patient stating that overall her voices are better today.  Patient with questions about potential discharge.  Advised her that we want to continue to work with her program and when she feels fully stable and ready then we will absolutely transition her.    Led into a discussion with patient having specific questions about her medication regimen.  She stated "I thought you said gabapentin was going to be 900 yesterday. " advised her that did order it and then was directed by pharmacy to change it to 600 mg t.i.d. because of her creatinine clearance.  Did discuss renal compromise as a barrier for gabapentin.  Patient stated that all okay I understand because I did have kidney failure before.  Clinical stated last year she was in the  hospital in the ICU with kidney failure.  Stated that she has been having UTIs since she was 67-64 years of age.  Patient okay with gabapentin 600 mg t.i.d..  However did bring up the fact that if she has ever been on prazosin before.  Patient stated that she is taken it before in the past 1 mg at nighttime and aware that it is for nightmares.  Discussed how this can be a b.i.d. medication and can treat sequelae of PTSD during the daytime such as hypervigilance and or dissociated phenomenon related to flashbacks.  Patient initially stating that she really like propranolol then when discussing prazosin she stated why do not we change it to this.  Advised her that would change to 1 mg q.a.m. and q.h.s..  Patient stating spontaneously "I think I might need 1 more day here.  Declined any acute desire to harm herself or feeling in danger on the unit.         Mental Status Examination:  Appearance: Patient appears their stated age, has fair grooming and hygiene, and is wearing hospital attire.  Normal muscle tone.  Behavior: no psychomotor agitation or retardation, good eye contact, and calm  Speech:   normal rate  Mood: "I'm groggy"   Affect: congruent to stated mood  Thought processes:  coherent, logical, and linear  Thought content:   -  Suicidal and homicidal ideation: no suicidal ideation         -  Abnormal or psychotic thoughts: no evidence of abnormal perceptual disturbances  Insight: Adequate  Judgment: Impaired - minimal  Attention and Concentration: not distracted during conversation.  Gait:  not assessed    Laboratory Studies:   No results found for this or any previous visit (from the past 24 hour(s)).      Assessment and Plan:  Jill Wood is a 27 year old female with history of schizoaffective disorder, bipolar type and methamphetamine use disorder who presents to ED brought in by self from rehab center voluntarily for worsening AVH and mania. UDS negative.      Pt presents with worsening AVH, paranoid  ideation, and anxiety in the setting of recent discontinuation of Ativan and new sobriety. Pt also reports recent decreased need for sleep, increased risky behavior, and dressing up inappropriately. MSE notable for psychomotor activation and rapid speech. Patient's history appears to be characterized by chronic AVH and paranoia with discrete episodes of manic symptoms. Pt denies current SI or HI. Pt's presentation is most consistent with exacerbation of underlying schizoaffective disorder, bipolar type. Pt is currently GD and would benefit from inpatient psychiatric hospitalization for stabilization, medication management, and optimization.     DSM 5 Diagnoses:  Schizoaffective Disorder, Bipolar Type* - F25.0    Active Hospital Problems    Diagnosis    *Unspecified psychosis not due to a substance or known physiological condition (CMS-HCC) [F29]    Schizoaffective disorder (CMS-HCC) [F25.9]      Resolved Hospital Problems   No resolved problems to display.       Plan:  1. Psychiatric Medication Management:  -- continue Seroquel 100mg /100mg /600mg , for psychosis  -- continue Lithium 600 nightly for mood stabilization    Per medication regimen at [facility]  -continue propranolol 20 t.i.d. but will discontinue and start prazosin 1 mg q.a.m. and q.h.s., presumably for insomnia, nightmares, hypervigilance and sequelae of PTSD    -continue gabapentin 600 mg t.i.d.. for pain and generalized anxiety         -monitor dosage given history of renal failure    PRNs:  -- continue Ativan 2 mg q.6 p.r.n. specifically not to exceed a total of 4 mg in 24 hours for generalized anxiety and patient reporting withdrawal feelings.  Advised patient that will not be prescribed this upon discharge.  Patient aware.  -- Nicotine gum PRN    2. Medical Issues:   # None acute    3. Psychological/Behavioral/Social:   -- Continue individual, group, milieu, and allied therapy services    4. Legals:   Legal Status/Legal Hold   Procedures     Voluntary but Holdable     -- Precautions: None  5. Disposition:  Back to [facility] view in the next 1-3 days.

## 2022-07-26 NOTE — Plan of Care (Signed)
Problem: [facility]  Goal: Patient will demonstrate behavior improvement and be able to function outside hospital by discharge or behavior will be stable for transfer to outside facility.  Outcome: Progressing  Flowsheets (Taken 07/26/2022 2228)  Population/Condition Focus:   Schizophrenia/Psychosis   Chemical Dependence and Withdrawal  Individualized Short Term Goal by End of Shift: Patient will identify at least one positive coping mechanism to deal with emotional trigger  Individualized Interventions: Encourage patient to verbalize feelings and concerns to staff, explore positive coping skills with patient and assist patient in identifying useful positive coping mechanisms for patient's use in dealing with emotional trigger, encourage group attendance, offer PRN medications as needed and provide positive feedback  Note:     Risk Factors:  Suicide Risk: No evidence of thoughts or plans  Self-Harm Risk: No evidence of thoughts or plans  Assault Risk: No evidence of assault/aggressive behavior or plan  Hypersexual Risk: No risk  AWOL Risk: No evidence of thought or plan      Patient/Family Stated Goal:  Patient/Family Stated Goal: None identified for this evening    Triggers & Coping Mechanisms:  Triggers: Someone cursing;Someone calling you names;People ignoring you;Yelling  Coping Mechanisms: Thinking of something pleasant;Medications;Relaxation exercises;Reading;Having a snack    Outcome Evaluation: Progressing    Rationale: Patient has been visible in the milieu intermittently this shift. She spent her time in the dayarea watching tv at times, talking on the telephone at times and in her room in bed at times throughout the evening. Patient attended group this evening. She presented with irritable and labile affect and anxious mood. She endorsed SI without plan earlier in the shift, but denied it later in the shift. She c/o AH commanding her to kill herself. She denied any plan or urges to act on  the commands but was distressed by them. She also endorsed VH of faces in the clouds outside of her window. OD notified and came to evaluate patient. No new orders received at that time. Advised to continue current medication regimen and plan of care. Patient c/o anxiety 10/10 and received Ativan 2mg  PO PRN for anxiety, with fair effect. She also c/o bodyaches 10/10 and headache 10/10 for which she received Tylenol 650mg  PO PRN for pain, which was fairly effective. Additionally, patient received Nicotine gum 4mg  x 3 this shift for nicotine cravings. Patient complied with scheduled medications this shift. She consumed 100% of dinner. Her latest vital signs were WNL. Patient c/o vaginal discharge and foul odor, denied any vaginal itching or burning. Patient requested STD testing. Patient's complaints were endorsed to OD and will be deferred to morning treatment team. Will continue to monitor patient q76min for safety.           Recommendations: Continue following treatment plan

## 2022-07-26 NOTE — Plan of Care (Signed)
Problem:  [facility]  Goal: Patient will demonstrate behavior improvement and be able to function outside hospital by discharge or behavior will be stable for transfer to outside facility.  Flowsheets (Taken 07/26/2022 0807)  Population/Condition Focus:   Schizophrenia/Psychosis   Chemical Dependence and Withdrawal  Short Term Goal by End of Shift: Actively participate in one group therapy session  Individualized Interventions:   Educate patient importance and benefits of attending groups throughout the day   let patient know when groups start   encourage patient to actively participate in groups    1237: Patient has labile mood and is easily agitated. She c/o not getting enough food and wants double portion. Dr. Jennet Maduro made aware- double portion of vegetables and fruits were ordered. Supervisor spoke with her about the way meals are ordered. She denies feeling sad or depressed. She denies SI/HI/AVH. She gets upset easily when her requests are not met. She took her 0900 medications. She was given ativan 2 mg for anxiety- was slightly effective.     Risk Factors:  Suicide Risk: No evidence of thoughts or plans  Self-Harm Risk: No evidence of thoughts or plans  Assault Risk: No evidence of assault/aggressive behavior or plan  Hypersexual Risk: No risk  AWOL Risk: No evidence of thought or plan      Patient/Family Stated Goal:  Patient/Family Stated Goal: none stated    Triggers & Coping Mechanisms:  Triggers: Someone cursing;Someone calling you names;People ignoring you;Yelling  Coping Mechanisms: Thinking of something pleasant;Medications;Relaxation exercises;Reading;Having a snack    Outcome Evaluation: Progressing    Rationale: Attends select groups.    Recommendations: Continue to encourage group participation.      Restraints (Document known precipitating event(s), nursing intervention(s), patient response to intervention(s) in narrative note.):    N/A

## 2022-07-27 ENCOUNTER — Other Ambulatory Visit: Payer: Self-pay

## 2022-07-27 DIAGNOSIS — F259 Schizoaffective disorder, unspecified: Secondary | ICD-10-CM

## 2022-07-27 MED ORDER — PRAZOSIN HCL 1 MG OR CAPS
1.0000 mg | ORAL_CAPSULE | Freq: Two times a day (BID) | ORAL | 0 refills | Status: AC
Start: 2022-07-27 — End: 2022-08-10
  Filled 2022-07-27: qty 28, 14d supply, fill #0

## 2022-07-27 MED ORDER — LITHIUM CARBONATE 600 MG OR CAPS
600.0000 mg | ORAL_CAPSULE | Freq: Every evening | ORAL | 0 refills | Status: AC
Start: 2022-07-27 — End: 2022-08-10
  Filled 2022-07-27: qty 14, 14d supply, fill #0

## 2022-07-27 MED ORDER — QUETIAPINE FUMARATE 100 MG OR TABS
100.0000 mg | ORAL_TABLET | Freq: Two times a day (BID) | ORAL | 0 refills | Status: DC
Start: 2022-07-27 — End: 2022-07-27

## 2022-07-27 MED ORDER — QUETIAPINE FUMARATE 100 MG OR TABS
100.0000 mg | ORAL_TABLET | Freq: Two times a day (BID) | ORAL | 0 refills | Status: AC
Start: 2022-07-27 — End: 2022-08-10
  Filled 2022-07-27: qty 28, 14d supply, fill #0

## 2022-07-27 MED ORDER — GABAPENTIN 300 MG OR CAPS
600.0000 mg | ORAL_CAPSULE | Freq: Three times a day (TID) | ORAL | 0 refills | Status: AC
Start: 2022-07-27 — End: 2022-08-10
  Filled 2022-07-27: qty 84, 14d supply, fill #0

## 2022-07-27 MED ORDER — CEFDINIR 300 MG OR CAPS
300.0000 mg | ORAL_CAPSULE | Freq: Two times a day (BID) | ORAL | 0 refills | Status: AC
Start: 2022-07-27 — End: 2022-07-30
  Filled 2022-07-27: qty 6, 3d supply, fill #0

## 2022-07-27 MED ORDER — QUETIAPINE FUMARATE 300 MG OR TABS
600.0000 mg | ORAL_TABLET | Freq: Every evening | ORAL | 0 refills | Status: AC
Start: 2022-07-27 — End: 2022-08-10
  Filled 2022-07-27: qty 28, 14d supply, fill #0

## 2022-07-27 NOTE — Event / Update (Addendum)
After a comprehensive evaluation, it has been determined that patient is cleared both psychiatrically and medically for discharge. The patient has been thoroughly assessed and no longer meets the criteria necessary for continued inpatient hospitalization within our locked facility. Patient denies suicidal or homicidal ideations, and although endorses some voices, does not seem to be responding to internal stimuli nor are the voices debilitating to her life and functioning. Patient is able to have intact, linear conversation with staffs, mental status exam is within normal limits. Patient is also medically cleared and stable to be discharged, labs are within normal limits, there is no acute medical needs or issues that warrant inpatient stay or intervention. A detailed discharge plan has been developed and reviewed with the patient, including follow-up care appointments, medication management, and support services to ensure a successful transition back to the community. The patient has been provided with all necessary resources and contacts. We are confident in patient's for discharge and believe they are well-prepared to continue their recovery in an outpatient setting.     Lavone Neri, MD.  Resident Physician

## 2022-07-27 NOTE — Interdisciplinary (Signed)
Discharge Note: Patient picked up by [facility] staff to be discharged to [facility] on 07/27/2022. Pt denies SI/HI/AH/VH, mood is concurrent. Patient educated on discharge planning, follow up appointments, and discharge medication regimen. Pt is cooperative and receptive to discharge plans and denies questions at this time. Pt endorsing mild anxiety about discharge, requesting Ativan PRN, MD order in place OK to give Ativan early prior to discharge, pt received Ativan 2 mg PO PRN at 1531. Pt also requesting nicotine gum, received at 1559. Patient verbalized understanding, signed AVS form, refused to sign for valuables and belongings. Pt was given a copy of discharge instructions, labs, and med scripts. Belongings/valuables returned to patient. Patient is ambulating, gait steady. Pt dressed appropriately for the weather. Pt escorted out of unit at 1604 by 2 staff.

## 2022-07-27 NOTE — Interdisciplinary (Signed)
Social Work Process Group        Focus of Group: TEFL teacher   Patient attended 0 minutes out of 60 minutes of the group.     Patient did not attend Social Work Process group for the following reason: Group was offered, but patient declined to attend.  MSWi to encourage patient to attend groups in the future.      Angelene Giovanni, MSW Intern

## 2022-07-27 NOTE — Interdisciplinary (Addendum)
Pt has been asleep throughout the night with minimal interruptions to sleep. Pt was able to sleep a total of 10 hrs. Respirations were even and unlabored. No signs and symptoms of distress noted during the shift. Will continue to monitor pt and will endorse any issues onto the next shift.       07/27/22 0600   Sleep Activities   Sleep Hours (0600-2100) 3 hrs.   Sleep Hours (2100-0600) 7 hrs.   Total Sleep Hours 10 hrs.     Pt is irritable this morning about PRN Ativan and with staff. Pt reports that her food orders are incorrect and is demanding with staff to fix her orders.

## 2022-07-27 NOTE — Interdisciplinary (Signed)
07/27/22 [place] 72 Hours Prior to Discharge   Family Meeting Conducted Over the Phone   Goal of Meeting Discuss pt's progress in treatment and readiness for discharge   FAMILY ATTENDEES clinical director Clarene Essex with [facility] direct: [phone] and patient's mother, Graciela Husbands (consent on file) [phone]   Care Team Attendees Morton Stall, [place] LCSW spoke with clinical director Jenny Reichmann with [facility] who states they can accept patient back at their facility today.  he requested for discharge summary for review, and wanted confirmation pt will no discharge with any benzos. LCSW confirmed medications will be provided at time of discharge, and DC summary can be sent as requested.  He states they will send team member Zack McGlaughlen to pick patient up for safe transportation to their residential program.  LCSW spoke with patient's mother Cherie, and provided update of the aforementioned. She shared that she has also spoken to [facility] tx center and they are all on the same page for patients aftercare plan post DC.  LCSW confirmed suboxone will be discontinued, and pt will not be discharging with any benzos. LCSW confirmed transportation arrangements and MD(R) will call later today to provide further clarification on Dx and changes in psych med regimen. She verbalized an understanding of all discussed, is agreeable to the discharge plan for today.   OUTCOME Discharge today, 07/27/22 at 16:00 will be picke dup by [facility] staff Zack McGlaughlen for safe transportation back to residential tx center: [address] [place], [place] [address]         Pager#[phone]

## 2022-07-27 NOTE — Discharge Summary (Signed)
Bern[facility] SUMMARY    Patient Name:  HIAWATHA SCHMALTZ    Date of Admission:  07/23/2022  Date of Discharge:  07/27/2022    Principal Diagnosis: Schizoaffective disorder (CMS-HCC)     Follow Up Recommendations:  - Please follow-up with your outpatient psychiatrist and primary physician  - Please continue your prescribed medications. See below.     Follow Up Appointments:  No future appointments.    Resources:  -Helpful websites: nami.org, psychiatry.org  -Find a therapist: psychology.org  -National Suicide Prevention Hotlines: 1-800 273-TALK 1-800 K1103447, 1-800 SUICIDE / 1-(406)866-9514, 1-800 442-HOPE / 1-800 [place] Crisis Prevention Hotline: 804-427-8704 7CRISIS / 469-296-7238 438-427-6251, suicidepreventionlifeline.Circle for Mental Illness Warm Line to talk to someone, (non-emergency): 1-877 910-WARM / 1-877 WN:207829   http://www.namioc.org  -Alcoholics Anonymous 24 Hour Help Line 313-543-4040  -Narcotics Anonymous 24-Hour Help Line 510-767-3245  -National Domestic Violence Hotline: 1-800 799-SAFE / 1-800 984-133-4478     Hospital Problem List:  [facility] Problems    Diagnosis    *Schizoaffective disorder (CMS-HCC) [F25.9]    Unspecified psychosis not due to a substance or known physiological condition (CMS-HCC) [facility]   No resolved problems to display.       Discharge MedicationsQ     What To Do With Your Medications        START taking these medications        Add'l Info   cefdinir 300 MG capsule  Commonly known as: OMNICEF  Take 1 capsule (300 mg) by mouth every 12 hours for 3 days Indications: Urinary Tract Infection.   Quantity: 6 capsule  Refills: 0     gabapentin 300 MG capsule  Commonly known as: NEURONTIN  Take 2 capsules (600 mg) by mouth 3 times daily for 14 days.   Quantity: 84 capsule  Refills: 0     lithium 600 MG capsule  Take 1 capsule (600 mg) by mouth nightly for 14 days.   Quantity: 14 capsule  Refills: 0      prazosin 1 MG capsule  Commonly known as: MINIPRESS  Take 1 capsule (1 mg) by mouth 2 times daily for 14 days.   Quantity: 28 capsule  Refills: 0     * QUEtiapine 300 MG tablet  Commonly known as: SEROQUEL  Take 2 tablets (600 mg) by mouth at bedtime for 14 days.   Quantity: 28 tablet  Refills: 0     * QUEtiapine 100 MG tablet  Commonly known as: SEROQUEL  Take 1 tablet (100 mg) by mouth 2 times daily for 14 days. Take one tab at 0600 (6am) and one tab at 1400 (2pm).   Quantity: 28 tablet  Refills: 0           * This list has 2 medication(s) that are the same as other medications prescribed for you. Read the directions carefully, and ask your doctor or other care provider to review them with you.                   Where to Get Your Medications        These medications were sent to [facility] [phone]      Hours: Monday-Friday 8 am to 7 pm; Saturday-Sunday 9 am to 4:30 pm Phone: [phone]   cefdinir 300 MG capsule  gabapentin 300 MG capsule  lithium 600 MG capsule  prazosin 1 MG capsule  QUEtiapine 100 MG tablet  QUEtiapine 300 MG tablet         Justification for Discharge on Multiple Antipsychotics:  The patient is being discharged on one antipsychotic.      Reason for Admission to the Hospital / History of Present Illness:   Increasing psychosis with decreasing executive functioning and ability to manage life safely    SHANETRA AMATUCCI is a 27 year old female with history of schizoaffective disorder, bipolar type, methamphetamine use disorder, and opioid use disorder who presents to ED brought in by self from rehab center voluntarily for worsening AVH and mania. UDS negative.      Over the past 2 weeks, she reports worsening command auditory hallucinations that have been telling her to hurt herself and other people. She also endorses some visual hallucinations that include color changes and upside down crosses or other religious themes. She reports the voices  have worsened in the setting of discontinuing Ativan upon going to rehab. She reports hearing voices in the past but they have "never been this bad" but denies wanting to follow through with the commands. These auditory hallucinations have been causing a lot of distress and anxiety for her which prompted her to come to the ED from her rehab facility. 19 days ago, she checked into a rehab facility and has been compliant with her medications. She is currently taking Seroquel 100mg  in the morning, 100mg  in the afternoon, and 400mg  at night. She had also been taking suboxone while at the rehab facility up to a dose of 8mg , but at present she has tapered to 4mg . She plans to go transfer to a rehab facility in [place] on 3/18 to be closer to her family, and this program does not allow suboxone. Patient says this current stay at the rehab center is the longest she has been sober in years and has not been evaluated by a psychiatrist while sober. Longest period of sobriety in the past was 4 months since starting using age age 39. Denies SI/HI.     Prior to admission to the rehab facility, she endorses heavy methamphetamine use, fentanyl use and benzodiazepine use. Patient has tried multiple antipsychotics in the past and has a history of EPS, even while taking Congetin and Benadryl.      With MD-R, pt also reports decreased need for sleep, feeling like engaging in provocative behavior, and dressing up excessively for casual activities and wearing excessive makeup. She also reports her current boyfriend thinks she is manic. She thinks her last manic episode was several months ago. She endorses that in the past few days she has started believing people are reading her mind and she can read other people's minds.     Hospital Course by Problem:       A. Psychiatric:   Diagnostic Evaluation:  Initial diagnostic consideration was consistent with patient's history only per previous reporting and charting as schizoaffective  disorder bipolar type.  As evidenced by some of the things that were reported in patient's history of present illness.  However upon further observation and multiple days of engagement on the unit there was no evidence of any ongoing psychosis, mania, hypomania, delirium consistent with a schizophrenia spectrum disorder or bipolar disorder.  Patient's presentation more consistent with aspects of PTSD, longstanding substance use disorder as documented elsewhere, substance induced psychosis, as well as longstanding borderline personality traits.  Patient was observed and reports experiencing ongoing auditory  hallucinations in a distressing fashion however her lack of negative symptoms, her ability to emotionally engage and at times being excessively emotional on the unit rendered a diagnosis of schizophrenia unlikely.  In addition patient reported that during more extensive periods of sobriety her psychotic symptoms would decrease.  Patient routinely discussed motivation to get back to employment, motivation for treatment, wanting to get back to [place] to reconnect with her family all which did not suggest a schizophrenia or schizoaffective disorder presentation.  This information and this revision of her diagnosis was explained to her specifically on 07/27/2022.  It was repeated several times for her own benefit to clarify because she had been told otherwise before.  During this conversation patient also alluded to the fact that she has been told she has borderline personality features longstanding.  Regardless patient was motivated for sobriety and did continued to express desire to want to leave the hospital.  She did not engage in any self-injurious behavior in any way shape or form while in the hospital.  She was observed to be medication seeking and constantly negotiating for various things such as more food, difficulty with boundaries, and wanting to be prescribed more benzodiazepines.  However as stated  patient did not engage in any self-injurious behavior, she did not express or manifest any behavior that would show an intent to hurt herself or take her life.  Thus after a number of days in the hospital and remaining voluntarily she was stable for discharge back to [facility].    Pharmacologically patient initially was restarted on some of her regimen from her outpatient program.  However after discussing various aspects of her treatment related to PTSD, nightmares, anxiety day-to-day secondary to the aforementioned diagnoses.  Her medications were revised and propranolol 20 mg t.i.d. was discontinued and started on prazosin 1 mg q.a.m. and q.h.s..  Subutex was started at 2 mg q.a.m. and q.h.s. for a small portion of her hospitalization and then discontinued upon discharge given that patient did not want to take it and ultimately her future program was not going to be able to prescribe it.  Patient lithium 300 mg q.a.m. and q.h.s. was consolidated to 600 mg q.h.s..  Quetiapine 100 mg q.a.m. and q.p.m. and 600 mg q.h.s. was continued appropriately.  Patient was also started on gabapentin 600 mg t.i.d. for various forms of anxiety given its non addictive nature.  Attempted to prescribe 900 t.i.d. however per her renal status and history of renal failure modified it to 600 t.i.d..  Patient continued to ask about benzodiazepines throughout her hospitalization.  She was prescribed Ativan 2 mg q.6 not to exceed 4 mg and was advised clearly that this would only be during hospitalization.    Discharge & Post-Discharge Plan: On day of discharge, Patient was visible on unit, appropriate with staff, and was encouraged to participate in individual, group, occupational, and milieu therapies. Patient denied suicidal ideation, homicidal ideation, or audio/visual hallucinations. Patient was reported to be eating and sleeping well. We discussed the importance of medication adherence and staying sober as it would reduce the risk of more  impulsive behaviors and rehospitalizations.  Patient is at low acute risk for imminent suicide, has euthymic affect, has been interacting appropriately with peers and staff, and is future oriented, discussing plans for follow-up with outpatient services and future goals.  The patient has a reasonable support system and plan of care, and denies access to a firearm. Patient was discharged in stable condition to [facility]  Center, advised to follow up with their outpatient psychiatrist, take following medications as prescribed: Gabapentin 600mg  PO TID + Lithium 600mg  PO nightly + Prazosin 1mg  PO BID + Seroquel 100mg  PO QAM + Seroquel 100mg  PO @1400  + Seroquel 600mg  PO nightly . Patient was also advised to avoid drug use and alcohol consumption for optimum mental health. Patient states they are willing and able to seek help or return to the nearest ER should their condition worsen.    B. Medical:   #UTI  - Continue Omnicef 300mg  PO BID for next 3 days, for total 7 days of treatment.     C. Legal:   Legal Status/Legal Hold   Procedures    Voluntary but Holdable       Consultations Obtained During This Hospitalization:       None    Suicide Risk Assessments at Discharge:  [place]-SUICIDE SEVERITY RATING SCALE: [place] Suicide Risk Severity Rate Scale  Discharge  1. While you were in the hospital, have you wished you were dead or wished you could go to sleep and not wake up?: No  2. While you were in the hospital, have you actually had any thoughts of killing yourself?: No  6. Have you ever done anything, started to do anything or prepared to do anything to end your life?: No  CSSRS Risk Level: Minimal Risk     Based on the above [place]-Suicide Severity Rating Scale and my review of the SAFE-T assessment from admission, I deem the patient to be at low acute risk of suicide.     Mental Status Examination at Discharge:    Appearance: As stated age, good grooming/hygiene, wearing appropriate clothing  Behavior:  Cooperative. Fair eye contact. Calm.  Gait: Steady gait  Muscle Strength/Tone: No atrophy or abnormal movements  Speech: Normal rate, tone and volume  Mood: "Okay"  Affect: Reactive, congruent  Thought Process: Linear, goal-directed         - Associations: Intact  Thought Content: No suicidal or homicidal ideation. Not responding to internal stimuli. No auditory or visual hallucinations.  Attention Span and Concentration: Not distracted in conversation  Language: Able to name objects  Fund of Knowledge: Average  Memory: Both recent and remote memories are intact in context of conversation  Orientation: Intact to person, place  Insight/Judgment: Fair    Key Physical Exam Findings at Discharge:  No significant physical examination findings at the time of discharge    Procedure(s) During This Hospitalization:  No results found.    MDRO Status: Negative    Allergies:  Allergies   Allergen Reactions    Haldol [Haloperidol] Other     EPS    Zyprexa [Olanzapine] Other     EPS         Discharge Condition:  Stable.    Discharge Disposition: [facility]     Discharge Code Status:  Full code / full care  This code status is not changed from the time of admission.    Discharging Physician's Contact Information:    [facility] [phone]

## 2022-07-27 NOTE — Interdisciplinary (Signed)
MSWi approached patient in her room where she was observed to be sleeping. Patient was amenable to speaking with this Probation officer and additional MSWi. Patient expressed distress due to return of AVH. Patient expressed interest in speaking with MD-A or MD-R regarding medications to help AVH subside. In interaction, pt provided minimal eye contact, appearing disheveled with unkempt hair and visible perspiration on skin. Pt expressed not wanting to return home until she is well. MSWi to relay pt concerns to tx team.     Upon conclusion of SW group at 10:25, pt seen on unit speaking to MD-A RE: medications.     Angelene Giovanni, MSW Intern

## 2022-07-27 NOTE — Interdisciplinary (Signed)
LCSW placed call to clinical director Clarene Essex with [facility] direct: [phone] and provided clinical update on pt's clinical presentation in treatment, readiness for discharge, and request to facilitate DC coord for today.  LCSW clarified MH Dx and medications, and inquired about plan for patient to go to residential tx program in [place].  He shared he needed to further consult with their tx team and call this writer back in a couple hours with an update on potential DC back to their facility or if patient's needs are higher than what they can accommodate at their facility while facilitating transfer to [place] residential treatment program.     LCSW placed call to patient's mother, Jill Wood (consent on file) [phone] who shared that she did not speak with patient this morning, but over the weekend patient was "All over the place emotionally" but she did speak with CM [name] with [facility] and they informed her a plan to ensure patient has been stabilized, no further benzo/suboxone, and assist in the transition to patient retuning home to [place] so she can begin [facility].  She confirmed patient is planned to start [facility] near their home residence, but is not permitted to be on suboxone and needs to be completely off benzos for the program. Pt was planned to stay with parents until intake date for residential, but if patient is not clinically stable then she does not feel comfortable with patient staying at family residence while awaiting residential admission again.  LCSW confirmed to follow up with [facility] tx center and provide update to pts mother as it's obtained.     Pager#[phone]

## 2022-07-27 NOTE — Plan of Care (Signed)
Problem: [facility]  Goal: Patient will demonstrate behavior improvement and be able to function outside hospital by discharge or behavior will be stable for transfer to outside facility.  Flowsheets (Taken 07/27/2022 0911)  Population/Condition Focus:   Schizophrenia/Psychosis   Chemical Dependence and Withdrawal  Short Term Goal by End of Shift: Perform self-care routine without prompts  Individualized Interventions:   Encourage to shower/brush teeth   provide hygiene supplies   educate about its benefits on her well being.    1130: Patient denies feeling sad or depressed but she has labile mood and she easily gets irritated and upset. She admits to having visual hallucination. She stated that she sees blood all over and men in her bed. She denies having command hallucination. She was encouraged to attend milieu activities to keep herself occupied and distracted.     1306: Has not attended any group offered.     Risk Factors:  Suicide Risk: No evidence of thoughts or plans  Self-Harm Risk: No evidence of thoughts or plans  Assault Risk: No evidence of assault/aggressive behavior or plan  Hypersexual Risk: No risk  AWOL Risk: No evidence of thought or plan      Patient/Family Stated Goal:  Patient/Family Stated Goal: none stated    Triggers & Coping Mechanisms:  Triggers: Someone cursing;Someone calling you names;People ignoring you;Yelling  Coping Mechanisms: Thinking of something pleasant;Medications;Relaxation exercises;Reading;Having a snack    Outcome Evaluation: Progressing    Rationale: Showered this shift.     Recommendations: Continue to educate about the benefits of having a good hygiene.      Restraints (Document known precipitating event(s), nursing intervention(s), patient response to intervention(s) in narrative note.):    N/A
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
## 2022-07-29 NOTE — Interdisciplinary (Signed)
CM placed a call to Delsa Sale at [facility] who left this writer a voicemail message inquiring about the patients discharge diagnosis, medications and aftercare. CM dialed [phone] ext [phone] and provided all information via voicemail message.

## 2022-07-30 LAB — C. TRACHOMATIS + N. GONORRHOEAE
Nucleic Acid Ct: NEGATIVE
Nucleic Acid Ng: NEGATIVE

## 2022-11-09 DEATH — deceased
